# Patient Record
Sex: Female | Born: 2000
Health system: Southern US, Community
[De-identification: ages and names within clinical notes are randomized; demographics above are authoritative.]

## PROBLEM LIST (undated history)

## (undated) DIAGNOSIS — T7840XA Allergy, unspecified, initial encounter: Secondary | ICD-10-CM

## (undated) DIAGNOSIS — T148XXA Other injury of unspecified body region, initial encounter: Secondary | ICD-10-CM

## (undated) DIAGNOSIS — E039 Hypothyroidism, unspecified: Secondary | ICD-10-CM

## (undated) HISTORY — PX: FRACTURE SURGERY: SHX138

## (undated) HISTORY — DX: Allergy, unspecified, initial encounter: T78.40XA

---
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
## 2018-01-10 ENCOUNTER — Ambulatory Visit
Admission: RE | Admit: 2018-01-10 | Discharge: 2018-01-10 | Disposition: A | Discharge status: Home / Self Care | Payer: 59 | Source: Ambulatory Visit | Attending: Family Medicine | Admitting: Family Medicine

## 2018-01-10 DIAGNOSIS — E063 Autoimmune thyroiditis: Secondary | ICD-10-CM

## 2018-01-10 DIAGNOSIS — E069 Thyroiditis, unspecified: Secondary | ICD-10-CM | POA: Diagnosis not present

## 2018-01-10 DIAGNOSIS — E049 Nontoxic goiter, unspecified: Secondary | ICD-10-CM

## 2018-06-12 NOTE — Progress Notes (Signed)
Subjective:    Pamela Bennett is a 17 y.o. female and is here for a comprehensive physical exam.  No concerns. Finished semester. On Christmas break. Still taking Levothyroxine and Metformin. Weight up 7 pounds. No new habits other than college lifestyle.   Discussed HPV vaccine.    Current Outpatient Medications:  .  metFORMIN (GLUCOPHAGE) 500 MG tablet, TAKE 1 TABLET (500 MG TOTAL) BY MOUTH 2 TIMES DAILY WITH A MEAL., Disp: 60 tablet, Rfl: 6 .  SYNTHROID 25 MCG tablet, TAKE 1 TABLET BY MOUTH DAILY, Disp: 90 tablet, Rfl: 3  There are no preventive care reminders to display for this patient.  PMHx, SurgHx, SocialHx, Medications, and Allergies were reviewed in the Visit Navigator and updated as appropriate.   Past Medical History:  Diagnosis Date  . Hypothyroidism   . Ligament tear 02/2015   right small finger RCL tear     Past Surgical History:  Procedure Laterality Date  . CLOSED REDUCTION RADIAL / ULNAR SHAFT FRACTURE Left 2004  . LIGAMENT REPAIR Right 03/15/2015   Procedure: RIGHT SMALL FINGER RADIAL COLLATERAL LIGAMENT RECONSTRUCTION ;  Surgeon: Dominica SeverinWilliam Gramig, MD;  Location: [facility];  Service: Orthopedics;  Laterality: Right;  . PROXIMAL INTERPHALANGEAL FUSION (PIP) Right 03/15/2015   Procedure: RIGHT PROXIMAL INTERPHALANGEAL FUSION (PIP) JOINT WITH PALMARIS GRAFT;  Surgeon: Dominica SeverinWilliam Gramig, MD;  Location: [facility];  Service: Orthopedics;  Laterality: Right;  . SUPPRELIN IMPLANT Left 10/14/2009   upper arm     Family History  Adopted: Yes    Social History   Tobacco Use  . Smoking status: Never Smoker  . Smokeless tobacco: Never Used  Substance Use Topics  . Alcohol use: No  . Drug use: No    Review of Systems:   Pertinent items are noted in the HPI. Otherwise, ROS is negative.  Objective:   BP 120/64   Pulse (!) 58   Temp 98.5 F (36.9 C) (Oral)   Ht 5\' 3"  (1.6 m)   Wt 177 lb 12.8 oz (80.6 kg)   LMP  05/22/2018   SpO2 99%   BMI 31.50 kg/m    General appearance: alert, cooperative and appears stated age. Head: normocephalic, without obvious abnormality, atraumatic. Neck: no adenopathy, supple, symmetrical, trachea midline; thyroid not enlarged, symmetric, no tenderness/mass/nodules. Lungs: clear to auscultation bilaterally. Heart: regular rate and rhythm Abdomen: soft, non-tender; no masses,  no organomegaly. Extremities: extremities normal, atraumatic, no cyanosis or edema. Skin: skin color, texture, turgor normal, no rashes or lesions. Lymph: cervical, supraclavicular, and axillary nodes normal; no abnormal inguinal nodes palpated. Neurologic: grossly normal.  Assessment/Plan:   Pamela CheMargaret was seen today for annual exam.  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   [x]      Stressed the importance of regular exercise.    [x]     Substance Abuse: Discussed cessation/primary prevention of tobacco, alcohol, or other drug use; driving or other dangerous activities under the influence; availability of treatment for abuse.    [x]      Injury prevention: Discussed safety belts, safety helmets, smoke detector, smoking  near bedding or upholstery.    [x]      Sexuality: Discussed sexually transmitted diseases, partner selection, use of condoms, avoidance of unintended pregnancy  and contraceptive alternatives.    [x]     Dental health: Discussed importance of regular tooth brushing, flossing, and dental visits.   [x]      Health maintenance and immunizations reviewed. Please refer to Health maintenance section.   Helane RimaErica Abiageal Blowe, DO [facility]

## 2018-06-13 ENCOUNTER — Ambulatory Visit (INDEPENDENT_AMBULATORY_CARE_PROVIDER_SITE_OTHER): Payer: 59 | Admitting: Family Medicine

## 2018-06-13 ENCOUNTER — Encounter: Payer: Self-pay | Admitting: Family Medicine

## 2018-06-13 ENCOUNTER — Other Ambulatory Visit (HOSPITAL_COMMUNITY)
Admission: RE | Admit: 2018-06-13 | Discharge: 2018-06-13 | Disposition: A | Discharge status: Home / Self Care | Payer: 59 | Source: Ambulatory Visit | Attending: Family Medicine | Admitting: Family Medicine

## 2018-06-13 VITALS — BP 120/64 | HR 58 | Temp 98.5°F | Ht 63.0 in | Wt 177.8 lb

## 2018-06-13 DIAGNOSIS — E349 Endocrine disorder, unspecified: Secondary | ICD-10-CM

## 2018-06-13 DIAGNOSIS — Z113 Encounter for screening for infections with a predominantly sexual mode of transmission: Secondary | ICD-10-CM | POA: Insufficient documentation

## 2018-06-13 DIAGNOSIS — E063 Autoimmune thyroiditis: Secondary | ICD-10-CM

## 2018-06-13 DIAGNOSIS — Z0000 Encounter for general adult medical examination without abnormal findings: Secondary | ICD-10-CM

## 2018-06-13 DIAGNOSIS — Z1331 Encounter for screening for depression: Secondary | ICD-10-CM | POA: Diagnosis not present

## 2018-06-13 DIAGNOSIS — E88819 Insulin resistance, unspecified: Secondary | ICD-10-CM

## 2018-06-13 DIAGNOSIS — Z13220 Encounter for screening for lipoid disorders: Secondary | ICD-10-CM | POA: Diagnosis not present

## 2018-06-13 DIAGNOSIS — E8881 Metabolic syndrome: Secondary | ICD-10-CM | POA: Diagnosis not present

## 2018-06-13 LAB — LIPID PANEL
Cholesterol: 241 mg/dL — ABNORMAL HIGH (ref 0–200)
HDL: 38.3 mg/dL — ABNORMAL LOW (ref >39.00)
Total CHOL/HDL Ratio: 6
Triglycerides: 519 mg/dL — ABNORMAL HIGH (ref 0.0–149.0)

## 2018-06-13 LAB — CBC WITH DIFFERENTIAL/PLATELET
Basophils Absolute: 0 10*3/uL (ref 0.0–0.1)
Basophils Relative: 0.7 % (ref 0.0–3.0)
Eosinophils Absolute: 0.1 10*3/uL (ref 0.0–0.7)
Eosinophils Relative: 1.8 % (ref 0.0–5.0)
HCT: 44.3 % (ref 36.0–49.0)
Hemoglobin: 14.8 g/dL (ref 12.0–16.0)
Lymphocytes Relative: 31.5 % (ref 24.0–48.0)
Lymphs Abs: 1.8 10*3/uL (ref 0.7–4.0)
MCHC: 33.5 g/dL (ref 31.0–37.0)
MCV: 87.9 fl (ref 78.0–98.0)
Monocytes Absolute: 0.5 10*3/uL (ref 0.1–1.0)
Monocytes Relative: 8.1 % (ref 3.0–12.0)
Neutro Abs: 3.3 10*3/uL (ref 1.4–7.7)
Neutrophils Relative %: 57.9 % (ref 43.0–71.0)
Platelets: 305 10*3/uL (ref 150.0–575.0)
RBC: 5.04 Mil/uL (ref 3.80–5.70)
RDW: 13.6 % (ref 11.4–15.5)
WBC: 5.7 10*3/uL (ref 4.5–13.5)

## 2018-06-13 LAB — COMPREHENSIVE METABOLIC PANEL
ALT: 44 U/L — ABNORMAL HIGH (ref 0–35)
AST: 31 U/L (ref 0–37)
Albumin: 4.7 g/dL (ref 3.5–5.2)
Alkaline Phosphatase: 70 U/L (ref 47–119)
BUN: 9 mg/dL (ref 6–23)
CO2: 28 mEq/L (ref 19–32)
Calcium: 10.2 mg/dL (ref 8.4–10.5)
Chloride: 101 mEq/L (ref 96–112)
Creatinine, Ser: 0.87 mg/dL (ref 0.40–1.20)
GFR: 88.4 mL/min (ref >60.00)
Glucose, Bld: 94 mg/dL (ref 70–99)
Potassium: 4.3 mEq/L (ref 3.5–5.1)
Sodium: 138 mEq/L (ref 135–145)
Total Bilirubin: 0.4 mg/dL (ref 0.2–1.2)
Total Protein: 7.8 g/dL (ref 6.0–8.3)

## 2018-06-13 LAB — T4, FREE: Free T4: 0.82 ng/dL (ref 0.60–1.60)

## 2018-06-13 LAB — LDL CHOLESTEROL, DIRECT: Direct LDL: 135 mg/dL

## 2018-06-13 LAB — HEMOGLOBIN A1C: Hgb A1c MFr Bld: 5.5 % (ref 4.6–6.5)

## 2018-06-13 LAB — TSH: TSH: 2.55 u[IU]/mL (ref 0.40–5.00)

## 2018-06-13 NOTE — Addendum Note (Signed)
Addended by: London SheerFRIZZELL, BAILEY T on: 06/13/2018 10:22 AM   Modules accepted: Orders

## 2018-06-14 LAB — URINE CYTOLOGY ANCILLARY ONLY
Chlamydia: NEGATIVE
Neisseria Gonorrhea: NEGATIVE
Trichomonas: NEGATIVE

## 2018-06-16 ENCOUNTER — Encounter: Payer: Self-pay | Admitting: Family Medicine

## 2018-06-17 ENCOUNTER — Other Ambulatory Visit: Payer: Self-pay

## 2018-06-17 DIAGNOSIS — R899 Unspecified abnormal finding in specimens from other organs, systems and tissues: Secondary | ICD-10-CM

## 2018-06-17 LAB — TESTOS,TOTAL,FREE AND SHBG (FEMALE)
Free Testosterone: 7.3 pg/mL — ABNORMAL HIGH (ref 0.1–6.4)
Sex Hormone Binding: 18 nmol/L (ref 17–124)
Testosterone, Total, LC-MS-MS: 43 ng/dL (ref 2–45)

## 2018-06-17 LAB — URINE CYTOLOGY ANCILLARY ONLY
Bacterial vaginitis: NEGATIVE
Candida vaginitis: NEGATIVE

## 2018-06-17 NOTE — Telephone Encounter (Signed)
Left message to return call to our office.  

## 2018-06-26 NOTE — Progress Notes (Signed)
Kaymani Fauth is a 18 y.o. female is here for follow up.  History of Present Illness:   HPI: Patient presents for recheck of labs. Endorsed drinking more alcohol than normal for the week just before labs last week. Adopted. FamHx not clear. No tobacco, drugs. No ETOH this week. Exercises.   There are no preventive care reminders to display for this patient. Depression screen [facility] 2/9 06/13/2018 06/07/2017  Decreased Interest 0 0  Down, Depressed, Hopeless 0 0  PHQ - 2 Score 0 0  Altered sleeping 0 -  Tired, decreased energy 0 -  Change in appetite 0 -  Feeling bad or failure about yourself  0 -  Trouble concentrating 0 -  Moving slowly or fidgety/restless 0 -  Suicidal thoughts 0 -  PHQ-9 Score 0 -  Difficult doing work/chores Not difficult at all -   PMHx, SurgHx, SocialHx, FamHx, Medications, and Allergies were reviewed in the Visit Navigator and updated as appropriate.   Patient Active Problem List   Diagnosis Date Noted  . 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 Regular astigmatism of both eyes 06/08/2017  . 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 Thyroiditis, autoimmune 11/21/2012  . Osgood-Schlatter's disease 02/10/2012  . Goiter   . Hypothyroidism, acquired, autoimmune   . Short stature   . Dyspepsia   . Obesity   . Precocious sexual development and puberty 10/15/2010   Social History   Tobacco Use  . Smoking status: Never Smoker  . Smokeless tobacco: Never Used  Substance Use Topics  . Alcohol use: No  . Drug use: No   Current Medications and Allergies:   Current Outpatient Medications:  .  metFORMIN (GLUCOPHAGE) 500 MG tablet, TAKE 1 TABLET (500 MG TOTAL) BY MOUTH 2 TIMES DAILY WITH A MEAL., Disp: 60 tablet, Rfl: 6 .  SYNTHROID 25 MCG tablet, TAKE 1 TABLET BY MOUTH DAILY, Disp: 90 tablet, Rfl: 3 .  rosuvastatin (CRESTOR) 10 MG tablet, Take 1 tablet (10 mg total) by mouth daily., Disp: 90 tablet, Rfl: 3   Allergies  Allergen Reactions    . Almond Oil Other (See Comments)    GI UPSET  . Carrot [Daucus Carota] Other (See Comments)    GI UPSET   Review of Systems   Pertinent items are noted in the HPI. Otherwise, a complete ROS is negative.  Vitals:   Vitals:   06/27/18 1610  BP: 102/70  Pulse: 68  Temp: 98.7 F (37.1 C)  TempSrc: Oral  SpO2: 97%  Weight: 179 lb 9.6 oz (81.5 kg)  Height: 5\' 3"  (1.6 m)     Body mass index is 31.81 kg/m.  Physical Exam:   Physical Exam Vitals signs and nursing note reviewed.  HENT:     Head: Normocephalic and atraumatic.  Eyes:     Pupils: Pupils are equal, round, and reactive to light.  Neck:     Musculoskeletal: Normal range of motion and neck supple.  Cardiovascular:     Rate and Rhythm: Normal rate and regular rhythm.     Heart sounds: Normal heart sounds.  Pulmonary:     Effort: Pulmonary effort is normal.  Abdominal:     Palpations: Abdomen is soft.  Skin:    General: Skin is warm.  Psychiatric:        Behavior: Behavior normal.    Results for orders placed or performed in visit on 06/27/18  Lipid panel  Result Value Ref Range   Cholesterol 236 (H) 0 - 200 mg/dL  Triglycerides (H) 0.0 - 149.0 mg/dL    017.5 Triglyceride is over 400; calculations on Lipids are invalid.   HDL 43.00 >39.00 mg/dL   Total CHOL/HDL Ratio 5   Comprehensive metabolic panel  Result Value Ref Range   Sodium 137 135 - 145 mEq/L   Potassium 3.8 3.5 - 5.1 mEq/L   Chloride 102 96 - 112 mEq/L   CO2 28 19 - 32 mEq/L   Glucose, Bld 85 70 - 99 mg/dL   BUN 13 6 - 23 mg/dL   Creatinine, Ser 1.02 0.40 - 1.20 mg/dL   Total Bilirubin 0.5 0.2 - 1.2 mg/dL   Alkaline Phosphatase 62 47 - 119 U/L   AST 27 0 - 37 U/L   ALT 43 (H) 0 - 35 U/L   Total Protein 7.6 6.0 - 8.3 g/dL   Albumin 4.7 3.5 - 5.2 g/dL   Calcium 9.9 8.4 - 58.5 mg/dL   GFR 27.78 >24.23 mL/min  LDL cholesterol, direct  Result Value Ref Range   Direct LDL 136.0 mg/dL   US Abdomen Limited Ruq  Result Date:  06/29/2018 CLINICAL DATA:  Elevated LFTs EXAM: ULTRASOUND ABDOMEN LIMITED RIGHT UPPER QUADRANT COMPARISON:  None. FINDINGS: Gallbladder: No gallstones or wall thickening visualized. No sonographic Murphy sign noted by sonographer. Common bile duct: Diameter: 2.9 mm Liver: Mild increased hepatic echogenicity compatible with hepatic steatosis or fatty infiltration. No focal hepatic abnormality or intrahepatic biliary dilatation. No surrounding free fluid or ascites. Portal vein is patent on color Doppler imaging with normal direction of blood flow towards the liver. IMPRESSION: Mild hepatic steatosis.  Negative for gallstones or acute finding. Electronically Signed   By: Judie Petit.  Shick M.D.   On: 06/29/2018 11:09   Assessment and Plan:   Jlisa was seen today for follow-up.  Diagnoses and all orders for this visit:  Mixed hyperlipidemia -     Lipid panel -     Comprehensive metabolic panel -     LDL cholesterol, direct -     rosuvastatin (CRESTOR) 10 MG tablet; Take 1 tablet (10 mg total) by mouth daily.  Hypothyroidism, acquired, autoimmune Comments: Not on Levothyroxine at this time.   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 Orders and follow up as documented in EpicCare, reviewed diet, exercise and weight control, cardiovascular risk and specific lipid/LDL goals reviewed, reviewed medications and side effects in detail.  . Reviewed expectations re: course of current medical issues. . Outlined signs and symptoms indicating need for more acute intervention. . Patient verbalized understanding and all questions were answered. . Patient received an After Visit Summary.  Helane Rima, DO [place] 07/04/2018

## 2018-06-27 ENCOUNTER — Ambulatory Visit (INDEPENDENT_AMBULATORY_CARE_PROVIDER_SITE_OTHER): Payer: 59 | Admitting: Family Medicine

## 2018-06-27 ENCOUNTER — Encounter: Payer: Self-pay | Admitting: Family Medicine

## 2018-06-27 VITALS — BP 102/70 | HR 68 | Temp 98.7°F | Ht 63.0 in | Wt 179.6 lb

## 2018-06-27 DIAGNOSIS — R899 Unspecified abnormal finding in specimens from other organs, systems and tissues: Secondary | ICD-10-CM

## 2018-06-27 DIAGNOSIS — E349 Endocrine disorder, unspecified: Secondary | ICD-10-CM

## 2018-06-27 DIAGNOSIS — E8881 Metabolic syndrome: Secondary | ICD-10-CM

## 2018-06-27 DIAGNOSIS — E782 Mixed hyperlipidemia: Secondary | ICD-10-CM

## 2018-06-27 DIAGNOSIS — E063 Autoimmune thyroiditis: Secondary | ICD-10-CM | POA: Diagnosis not present

## 2018-06-27 DIAGNOSIS — E88819 Insulin resistance, unspecified: Secondary | ICD-10-CM

## 2018-06-27 NOTE — Telephone Encounter (Signed)
Patient has app today  

## 2018-06-28 ENCOUNTER — Other Ambulatory Visit: Payer: Self-pay

## 2018-06-28 DIAGNOSIS — R945 Abnormal results of liver function studies: Principal | ICD-10-CM

## 2018-06-28 DIAGNOSIS — R7989 Other specified abnormal findings of blood chemistry: Secondary | ICD-10-CM

## 2018-06-28 LAB — COMPREHENSIVE METABOLIC PANEL
ALT: 43 U/L — ABNORMAL HIGH (ref 0–35)
AST: 27 U/L (ref 0–37)
Albumin: 4.7 g/dL (ref 3.5–5.2)
Alkaline Phosphatase: 62 U/L (ref 47–119)
BUN: 13 mg/dL (ref 6–23)
CO2: 28 mEq/L (ref 19–32)
Calcium: 9.9 mg/dL (ref 8.4–10.5)
Chloride: 102 mEq/L (ref 96–112)
Creatinine, Ser: 0.81 mg/dL (ref 0.40–1.20)
GFR: 95.96 mL/min (ref >60.00)
Glucose, Bld: 85 mg/dL (ref 70–99)
Potassium: 3.8 mEq/L (ref 3.5–5.1)
Sodium: 137 mEq/L (ref 135–145)
Total Bilirubin: 0.5 mg/dL (ref 0.2–1.2)
Total Protein: 7.6 g/dL (ref 6.0–8.3)

## 2018-06-28 LAB — LIPID PANEL
Cholesterol: 236 mg/dL — ABNORMAL HIGH (ref 0–200)
HDL: 43 mg/dL (ref >39.00)
Total CHOL/HDL Ratio: 5
Triglycerides: 426 mg/dL — ABNORMAL HIGH (ref 0.0–149.0)

## 2018-06-28 LAB — LDL CHOLESTEROL, DIRECT: Direct LDL: 136 mg/dL

## 2018-06-29 ENCOUNTER — Ambulatory Visit
Admission: RE | Admit: 2018-06-29 | Discharge: 2018-06-29 | Disposition: A | Discharge status: Home / Self Care | Payer: 59 | Source: Ambulatory Visit | Attending: Family Medicine | Admitting: Family Medicine

## 2018-06-29 DIAGNOSIS — R7989 Other specified abnormal findings of blood chemistry: Secondary | ICD-10-CM

## 2018-06-29 DIAGNOSIS — R945 Abnormal results of liver function studies: Principal | ICD-10-CM

## 2018-06-29 DIAGNOSIS — K760 Fatty (change of) liver, not elsewhere classified: Secondary | ICD-10-CM | POA: Diagnosis not present

## 2018-07-01 ENCOUNTER — Encounter: Payer: Self-pay | Admitting: Family Medicine

## 2018-07-04 ENCOUNTER — Encounter: Payer: Self-pay | Admitting: Family Medicine

## 2018-07-04 MED ORDER — ROSUVASTATIN CALCIUM 10 MG PO TABS: 10.0000 mg | ORAL_TABLET | Freq: Every day | ORAL | 3 refills | Status: DC

## 2018-07-12 MED FILL — ROSUVASTATIN CALCIUM 10 MG: 10 | 90 days supply | Qty: 90 | Fill #0

## 2018-07-23 DIAGNOSIS — R21 Rash and other nonspecific skin eruption: Secondary | ICD-10-CM | POA: Diagnosis not present

## 2018-07-25 ENCOUNTER — Encounter: Payer: Self-pay | Admitting: Family Medicine

## 2018-11-16 MED FILL — ROSUVASTATIN CALCIUM 10 MG: 10 | 90 days supply | Qty: 90 | Fill #1

## 2018-12-12 ENCOUNTER — Encounter: Payer: Self-pay | Admitting: Family Medicine

## 2018-12-12 NOTE — Progress Notes (Deleted)
Virtual Visit via Video   Due to the COVID-19 pandemic, this visit was completed with telemedicine (audio/video) technology to reduce patient and provider exposure as well as to preserve personal protective equipment.   I connected with Marya LandryMargaret Marilyn Mapp by a video enabled telemedicine application and verified that I am speaking with the correct person using two identifiers. Location patient: Home Location provider: [facility], Office Persons participating in the virtual visit: Weston SettleMargaret Marilyn Mester, Terryann Verbeek Y Kelin Borum, CMA Barnie MortJoEllen Delainee Tramel, CMA acting as scribe for Dr. Helane RimaErica Wallace.    I discussed the limitations of evaluation and management by telemedicine and the availability of in person appointments. The patient expressed understanding and agreed to proceed.  Care Team   Patient Care Team: Helane RimaWallace, Erica, DO as PCP - General (Family Medicine)  Subjective:   HPI:  Mixed hyperlipidemia Labs checked at 06/27/18 visit. Patient started on Crestor 10mg  daily.  Is the patient taking medications without problems? {Yes/No:30480221}. Does the patient complain of muscle aches?  {Yes/No:30480221}. Trying to exercise on a regular basis? {Yes/No:30480221}. Compliant with diet? {Yes/No:30480221}.  Lab Results  Component Value Date   CHOL 236 (H) 06/27/2018   HDL 43.00 06/27/2018   LDLDIRECT 136.0 06/27/2018   TRIG (H) 06/27/2018    426.0 Triglyceride is over 400; calculations on Lipids are invalid.   CHOLHDL 5 06/27/2018   Lab Results  Component Value Date   ALT 43 (H) 06/27/2018   AST 27 06/27/2018   ALKPHOS 62 06/27/2018   BILITOT 0.5 06/27/2018      Hypothyroidism, acquired, autoimmune At last visit patient was not on  Levothyroxine.  Lab Results  Component Value Date   TSH 2.55 06/13/2018    Insulin resistance Patient currently on Metformin 500bid.   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 Regular astigmatism of both eyes 06/08/2017  . 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 Thyroiditis, autoimmune 11/21/2012  . Osgood-Schlatter's disease 02/10/2012  . Goiter   . Hypothyroidism, acquired, autoimmune   . Short stature   . Dyspepsia   . Obesity   . Precocious sexual development and puberty 10/15/2010    Social History   Tobacco Use  . Smoking status: Never Smoker  . Smokeless tobacco: Never Used  Substance Use Topics  . Alcohol use: No    Current Outpatient Medications:  .  metFORMIN (GLUCOPHAGE) 500 MG tablet, TAKE 1 TABLET (500 MG TOTAL) BY MOUTH 2 TIMES DAILY WITH A MEAL., Disp: 60 tablet, Rfl: 6 .  rosuvastatin (CRESTOR) 10 MG tablet, Take 1 tablet (10 mg total) by mouth daily., Disp: 90 tablet, Rfl: 3 .  SYNTHROID 25 MCG tablet, TAKE 1 TABLET BY MOUTH DAILY, Disp: 90 tablet, Rfl: 3  Allergies  Allergen Reactions  . Almond Oil Other (See Comments)    GI UPSET  . Carrot [Daucus Carota] Other (See Comments)    GI UPSET    Objective:   VITALS: Per patient if applicable, see vitals. GENERAL: Alert, appears well and in no acute distress. HEENT: Atraumatic, conjunctiva clear, no obvious abnormalities on inspection of external nose and ears. NECK: Normal movements of the head and neck. CARDIOPULMONARY: No increased WOB. Speaking in clear sentences. I:E ratio WNL.  MS: Moves all visible extremities without noticeable abnormality. PSYCH: Pleasant and cooperative, well-groomed. Speech normal rate and rhythm. Affect is appropriate. Insight  and judgement are appropriate. Attention is focused, linear, and appropriate.  NEURO: CN grossly intact. Oriented as arrived to appointment on time with no prompting. Moves both UE equally.  SKIN: No obvious lesions, wounds, erythema, or cyanosis noted on face or hands.  Depression screen [facility] 2/9 06/13/2018 06/07/2017  Decreased  Interest 0 0  Down, Depressed, Hopeless 0 0  PHQ - 2 Score 0 0  Altered sleeping 0 -  Tired, decreased energy 0 -  Change in appetite 0 -  Feeling bad or failure about yourself  0 -  Trouble concentrating 0 -  Moving slowly or fidgety/restless 0 -  Suicidal thoughts 0 -  PHQ-9 Score 0 -  Difficult doing work/chores Not difficult at all -    Assessment and Plan:   There are no diagnoses linked to this encounter.  Marland Kitchen COVID-19 Education: The signs and symptoms of COVID-19 were discussed with the patient and how to seek care for testing if needed. The importance of social distancing was discussed today. . Reviewed expectations re: course of current medical issues. . Discussed self-management of symptoms. . Outlined signs and symptoms indicating need for more acute intervention. . Patient verbalized understanding and all questions were answered. Marland Kitchen Health Maintenance issues including appropriate healthy diet, exercise, and smoking avoidance were discussed with patient. . See orders for this visit as documented in the electronic medical record.  Francella Solian, CMA  Records requested if needed. Time spent: *** minutes, of which >50% was spent in obtaining information about her symptoms, reviewing her previous labs, evaluations, and treatments, counseling her about her condition (please see the discussed topics above), and developing a plan to further investigate it; she had a number of questions which I addressed.

## 2018-12-13 ENCOUNTER — Ambulatory Visit: Payer: 59 | Admitting: Family Medicine

## 2018-12-13 ENCOUNTER — Other Ambulatory Visit: Payer: 59

## 2018-12-13 ENCOUNTER — Other Ambulatory Visit: Payer: Self-pay

## 2018-12-13 DIAGNOSIS — E538 Deficiency of other specified B group vitamins: Secondary | ICD-10-CM

## 2018-12-13 DIAGNOSIS — E349 Endocrine disorder, unspecified: Secondary | ICD-10-CM

## 2018-12-13 DIAGNOSIS — E063 Autoimmune thyroiditis: Secondary | ICD-10-CM

## 2018-12-13 DIAGNOSIS — E559 Vitamin D deficiency, unspecified: Secondary | ICD-10-CM

## 2018-12-14 ENCOUNTER — Other Ambulatory Visit: Payer: Self-pay

## 2018-12-14 ENCOUNTER — Other Ambulatory Visit (INDEPENDENT_AMBULATORY_CARE_PROVIDER_SITE_OTHER): Payer: 59

## 2018-12-14 DIAGNOSIS — E063 Autoimmune thyroiditis: Secondary | ICD-10-CM | POA: Diagnosis not present

## 2018-12-14 DIAGNOSIS — E559 Vitamin D deficiency, unspecified: Secondary | ICD-10-CM

## 2018-12-14 DIAGNOSIS — E349 Endocrine disorder, unspecified: Secondary | ICD-10-CM

## 2018-12-14 LAB — VITAMIN D 25 HYDROXY (VIT D DEFICIENCY, FRACTURES): VITD: 28.87 ng/mL — ABNORMAL LOW (ref 30.00–100.00)

## 2018-12-14 LAB — CBC WITH DIFFERENTIAL/PLATELET
Basophils Absolute: 0 10*3/uL (ref 0.0–0.1)
Basophils Relative: 0.5 % (ref 0.0–3.0)
Eosinophils Absolute: 0.1 10*3/uL (ref 0.0–0.7)
Eosinophils Relative: 1.6 % (ref 0.0–5.0)
HCT: 42.7 % (ref 36.0–46.0)
Hemoglobin: 14.2 g/dL (ref 12.0–15.0)
Lymphocytes Relative: 31.4 % (ref 12.0–46.0)
Lymphs Abs: 2.3 10*3/uL (ref 0.7–4.0)
MCHC: 33.3 g/dL (ref 30.0–36.0)
MCV: 89.7 fl (ref 78.0–100.0)
Monocytes Absolute: 0.7 10*3/uL (ref 0.1–1.0)
Monocytes Relative: 9.9 % (ref 3.0–12.0)
Neutro Abs: 4.1 10*3/uL (ref 1.4–7.7)
Neutrophils Relative %: 56.6 % (ref 43.0–77.0)
Platelets: 273 10*3/uL (ref 150.0–400.0)
RBC: 4.76 Mil/uL (ref 3.87–5.11)
RDW: 13.1 % (ref 11.5–14.6)
WBC: 7.3 10*3/uL (ref 4.5–10.5)

## 2018-12-14 LAB — COMPREHENSIVE METABOLIC PANEL
ALT: 22 U/L (ref 0–35)
AST: 20 U/L (ref 0–37)
Albumin: 4.6 g/dL (ref 3.5–5.2)
Alkaline Phosphatase: 58 U/L (ref 39–117)
BUN: 12 mg/dL (ref 6–23)
CO2: 26 mEq/L (ref 19–32)
Calcium: 9.4 mg/dL (ref 8.4–10.5)
Chloride: 105 mEq/L (ref 96–112)
Creatinine, Ser: 0.79 mg/dL (ref 0.40–1.20)
GFR: 92.49 mL/min (ref >60.00)
Glucose, Bld: 94 mg/dL (ref 70–99)
Potassium: 4.7 mEq/L (ref 3.5–5.1)
Sodium: 139 mEq/L (ref 135–145)
Total Bilirubin: 0.4 mg/dL (ref 0.2–1.2)
Total Protein: 7.5 g/dL (ref 6.0–8.3)

## 2018-12-14 LAB — LIPID PANEL
Cholesterol: 125 mg/dL (ref 0–200)
HDL: 42.3 mg/dL (ref >39.00)
LDL Cholesterol: 56 mg/dL (ref 0–99)
NonHDL: 83.01
Total CHOL/HDL Ratio: 3
Triglycerides: 136 mg/dL (ref 0.0–149.0)
VLDL: 27.2 mg/dL (ref 0.0–40.0)

## 2018-12-14 LAB — T4, FREE: Free T4: 0.74 ng/dL (ref 0.60–1.60)

## 2018-12-14 LAB — TSH: TSH: 1.9 u[IU]/mL (ref 0.35–5.50)

## 2018-12-25 NOTE — Progress Notes (Signed)
Virtual Visit via Video   Due to the COVID-19 pandemic, this visit was completed with telemedicine (audio/video) technology to reduce patient and provider exposure as well as to preserve personal protective equipment.   I connected with Marya LandryMargaret Marilyn Bingley by a video enabled telemedicine application and verified that I am speaking with the correct person using two identifiers. Location patient: Home Location provider: [facility], Office Persons participating in the virtual visit: Gar GibbonMargaret Marilyn Shasteen, Laneisha Mino, DO Barnie MortJoEllen Thompson, CMA acting as scribe for Dr. Helane RimaErica Gretel Cantu.   I discussed the limitations of evaluation and management by telemedicine and the availability of in person appointments. The patient expressed understanding and agreed to proceed.  Care Team   Patient Care Team: Helane RimaWallace, Layton Naves, DO as PCP - General (Family Medicine)  Subjective:   HPI:   1. Elevated LFTs with HLD Is the patient taking medications without problems? Yes. Does the patient complain of muscle aches?  No. Trying to exercise on a regular basis? Yes. Compliant with diet? No.  Lab Results  Component Value Date   CHOL 125 12/14/2018   HDL 42.30 12/14/2018   LDLCALC 56 12/14/2018   LDLDIRECT 136.0 06/27/2018   TRIG 136.0 12/14/2018   CHOLHDL 3 12/14/2018   Lab Results  Component Value Date   ALT 22 12/14/2018   AST 20 12/14/2018   ALKPHOS 58 12/14/2018   BILITOT 0.4 12/14/2018     2. Insulin resistance She is taking metformin daily. She is not having any issues with this medication at all.   Review of Systems  Constitutional: Negative for chills, fever, malaise/fatigue and weight loss.  HENT: Negative for hearing loss and tinnitus.   Respiratory: Negative for cough, shortness of breath and wheezing.   Cardiovascular: Negative for chest pain, palpitations and leg swelling.  Gastrointestinal: Negative for abdominal pain, constipation, diarrhea, nausea and vomiting.    Genitourinary: Negative for dysuria and urgency.  Musculoskeletal: Negative for joint pain and myalgias.  Skin: Negative for rash.  Neurological: Negative for dizziness and headaches.  Psychiatric/Behavioral: Negative for depression, substance abuse and suicidal ideas. The patient is not nervous/anxious.     Patient Active Problem List   Diagnosis Date Noted  . 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 Regular astigmatism of both eyes 06/08/2017  . 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 Thyroiditis, autoimmune 11/21/2012  . Osgood-Schlatter's disease 02/10/2012  . Goiter   . Hypothyroidism, acquired, autoimmune   . Short stature   . Dyspepsia   . Obesity   . Precocious sexual development and puberty 10/15/2010    Social History   Tobacco Use  . Smoking status: Never Smoker  . Smokeless tobacco: Never Used  Substance Use Topics  . Alcohol use: No   Current Outpatient Medications:  .  metFORMIN (GLUCOPHAGE) 500 MG tablet, TAKE 1 TABLET (500 MG TOTAL) BY MOUTH 2 TIMES DAILY WITH A MEAL., Disp: 60 tablet, Rfl: 6 .  rosuvastatin (CRESTOR) 10 MG tablet, Take 1 tablet (10 mg total) by mouth daily., Disp: 90 tablet, Rfl: 3 .  SYNTHROID 25 MCG tablet, TAKE 1 TABLET BY MOUTH DAILY, Disp: 90 tablet, Rfl: 3  Allergies  Allergen Reactions  . Almond Oil Other (See Comments)    GI UPSET  . Carrot [Daucus Carota] Other (See Comments)    GI UPSET   Objective:   VITALS: Per patient if applicable, see vitals. GENERAL: Alert, appears well and in no acute distress. HEENT: Atraumatic, conjunctiva clear, no obvious abnormalities on inspection of external nose  and ears. NECK: Normal movements of the head and neck. CARDIOPULMONARY: No increased WOB. Speaking in clear sentences. I:E ratio WNL.  MS: Moves all visible extremities without noticeable abnormality. PSYCH: Pleasant and cooperative, well-groomed. Speech normal rate and rhythm. Affect is appropriate. Insight and judgement are  appropriate. Attention is focused, linear, and appropriate.  NEURO: CN grossly intact. Oriented as arrived to appointment on time with no prompting. Moves both UE equally.  SKIN: No obvious lesions, wounds, erythema, or cyanosis noted on face or hands.  Depression screen [facility] 2/9 12/26/2018 06/13/2018 06/07/2017  Decreased Interest 0 0 0  Down, Depressed, Hopeless 0 0 0  PHQ - 2 Score 0 0 0  Altered sleeping 0 0 -  Tired, decreased energy 0 0 -  Change in appetite 0 0 -  Feeling bad or failure about yourself  0 0 -  Trouble concentrating 0 0 -  Moving slowly or fidgety/restless 0 0 -  Suicidal thoughts 0 0 -  PHQ-9 Score 0 0 -  Difficult doing work/chores Not difficult at all Not difficult at all -   Assessment and Plan:   Crescent was seen today for follow-up.  Diagnoses and all orders for this visit:  Elevated LFTs Comments: RESOLVED.  Mixed hyperlipidemia Comments: AT GOAL.  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 COVID-19 Education: The signs and symptoms of COVID-19 were discussed with the patient and how to seek care for testing if needed. The importance of social distancing was discussed today. . Reviewed expectations re: course of current medical issues. . Discussed self-management of symptoms. . Outlined signs and symptoms indicating need for more acute intervention. . Patient verbalized understanding and all questions were answered. Marland Kitchen Health Maintenance issues including appropriate healthy diet, exercise, and smoking avoidance were discussed with patient. . See orders for this visit as documented in the electronic medical record.  Briscoe Deutscher, DO  Records requested if needed. Time spent: 25 minutes, of which >50% was spent in obtaining information about her symptoms, reviewing her  previous labs, evaluations, and treatments, counseling her about her condition (please see the discussed topics above), and developing a plan to further investigate it; she had a number of questions which I addressed.

## 2018-12-26 ENCOUNTER — Ambulatory Visit (INDEPENDENT_AMBULATORY_CARE_PROVIDER_SITE_OTHER): Payer: 59 | Admitting: Family Medicine

## 2018-12-26 ENCOUNTER — Other Ambulatory Visit: Payer: Self-pay

## 2018-12-26 ENCOUNTER — Encounter: Payer: Self-pay | Admitting: Family Medicine

## 2018-12-26 VITALS — Ht 63.0 in | Wt 179.0 lb

## 2018-12-26 DIAGNOSIS — R945 Abnormal results of liver function studies: Secondary | ICD-10-CM

## 2018-12-26 DIAGNOSIS — E8881 Metabolic syndrome: Secondary | ICD-10-CM

## 2018-12-26 DIAGNOSIS — E063 Autoimmune thyroiditis: Secondary | ICD-10-CM | POA: Diagnosis not present

## 2018-12-26 DIAGNOSIS — R7989 Other specified abnormal findings of blood chemistry: Secondary | ICD-10-CM

## 2018-12-26 DIAGNOSIS — E349 Endocrine disorder, unspecified: Secondary | ICD-10-CM | POA: Diagnosis not present

## 2018-12-26 DIAGNOSIS — E782 Mixed hyperlipidemia: Secondary | ICD-10-CM | POA: Diagnosis not present

## 2018-12-26 DIAGNOSIS — E88819 Insulin resistance, unspecified: Secondary | ICD-10-CM

## 2019-01-11 ENCOUNTER — Ambulatory Visit (INDEPENDENT_AMBULATORY_CARE_PROVIDER_SITE_OTHER): Payer: 59 | Admitting: Physician Assistant

## 2019-01-11 ENCOUNTER — Encounter: Payer: Self-pay | Admitting: Physician Assistant

## 2019-01-11 DIAGNOSIS — Z20828 Contact with and (suspected) exposure to other viral communicable diseases: Secondary | ICD-10-CM

## 2019-01-11 DIAGNOSIS — Z7189 Other specified counseling: Secondary | ICD-10-CM | POA: Diagnosis not present

## 2019-01-11 DIAGNOSIS — Z20822 Contact with and (suspected) exposure to covid-19: Secondary | ICD-10-CM

## 2019-01-11 NOTE — Progress Notes (Signed)
Virtual Visit via Video   I connected with Dionisio David on 01/11/19 at  1:00 PM EDT by a video enabled telemedicine application and verified that I am speaking with the correct person using two identifiers. Location patient: Home Location provider: [facility], Office Persons participating in the virtual visit: Amritha, Yorke PA-C.  I discussed the limitations of evaluation and management by telemedicine and the availability of in person appointments. The patient expressed understanding and agreed to proceed.  I acted as a Education administrator for [employer], PA-C [employer], LPN  Subjective:   HPI:   Patient is requesting evaluation for possible COVID-19.  Symptom onset: None  Travel or Contacts: Pt was exposed to COVID due to co-worker was positive, was notified today. She always wears her mask and performs adequate hand hygiene and social distancing as able when working.  Patient endorses the following symptoms: Fever >100.57F []   Yes [x]   No []   Unknown Subjective fever (felt feverish) []   Yes [x]   No []   Unknown Chills []   Yes [x]   No []   Unknown Muscle aches (myalgia) []   Yes [x]   No []   Unknown Runny nose (rhinorrhea) []   Yes [x]   No []   Unknown Sore throat []   Yes [x]   No []   Unknown Cough (new onset or worsening of chronic cough) []   Yes [x]   No []   Unknown Shortness of breath (dyspnea) []   Yes [x]   No []   Unknown Nausea or vomiting []   Yes [x]   No []   Unknown Headache []   Yes [x]   No []   Unknown Abdominal pain  []   Yes [x]   No []   Unknown Diarrhea (?3 loose/looser than normal stools/24hr period) []   Yes [x]   No []   Unknown Other, specify: No loss of taste or smell Treatments tried: None  Patient risk factors: Smoker? []   Current []   Former [x]   Never If female, currently pregnant? []   Yes [x]   No  ROS: See pertinent positives and negatives per HPI.  Patient Active Problem List   Diagnosis Date Noted  . Refractive  amblyopia, left 06/08/2017  . Regular astigmatism of both eyes 06/08/2017  . 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 Thyroiditis, autoimmune 11/21/2012  . Osgood-Schlatter's disease 02/10/2012  . Goiter   . Hypothyroidism, acquired, autoimmune   . Short stature   . Dyspepsia   . Obesity   . Precocious sexual development and puberty 10/15/2010    Social History   Tobacco Use  . Smoking status: Never Smoker  . Smokeless tobacco: Never Used  Substance Use Topics  . Alcohol use: No    Current Outpatient Medications:  .  metFORMIN (GLUCOPHAGE) 500 MG tablet, TAKE 1 TABLET (500 MG TOTAL) BY MOUTH 2 TIMES DAILY WITH A MEAL., Disp: 60 tablet, Rfl: 6 .  rosuvastatin (CRESTOR) 10 MG tablet, Take 1 tablet (10 mg total) by mouth daily., Disp: 90 tablet, Rfl: 3 .  SYNTHROID 25 MCG tablet, TAKE 1 TABLET BY MOUTH DAILY, Disp: 90 tablet, Rfl: 3  Allergies  Allergen Reactions  . Almond Oil Other (See Comments)    GI UPSET  . Carrot [Daucus Carota] Other (See Comments)    GI UPSET    Objective:   VITALS: Per patient if applicable, see vitals. GENERAL: Alert, appears well and in no acute distress. HEENT: Atraumatic, conjunctiva clear, no obvious abnormalities on inspection of external nose and ears. NECK: Normal movements of the head and neck. CARDIOPULMONARY: No increased WOB. Speaking  in clear sentences. I:E ratio WNL.  MS: Moves all visible extremities without noticeable abnormality. PSYCH: Pleasant and cooperative, well-groomed. Speech normal rate and rhythm. Affect is appropriate. Insight and judgement are appropriate. Attention is focused, linear, and appropriate.  NEURO: CN grossly intact. Oriented as arrived to appointment on time with no prompting. Moves both UE equally.  SKIN: No obvious lesions, wounds, erythema, or cyanosis noted on face or hands.  Assessment and Plan:   [name] was seen today for covid-19 testing.  Diagnoses and all orders for this visit:  Close  Exposure to Covid-19 Virus -     Novel Coronavirus, NAA ([facility])  Advice Given About Covid-19 Virus Infection    Currently asymptomatic. We are going to send patient for drive-up testing. As a precaution, they have been advised to remain home until COVID-19 results and then possible further quarantine after that based on results and symptoms. Advised if they experience a "second sickening" or worsening symptoms as the illness progresses, they are to call the office for further instructions or seek emergent evaluation for any severe symptoms.   . Reviewed expectations re: course of current medical issues. . Discussed self-management of symptoms. . Outlined signs and symptoms indicating need for more acute intervention. . Patient verbalized understanding and all questions were answered. Marland Kitchen. Health Maintenance issues including appropriate healthy diet, exercise, and smoking avoidance were discussed with patient. . See orders for this visit as documented in the electronic medical record.  I discussed the assessment and treatment plan with the patient. The patient was provided an opportunity to ask questions and all were answered. The patient agreed with the plan and demonstrated an understanding of the instructions.   The patient was advised to call back or seek an in-person evaluation if the symptoms worsen or if the condition fails to improve as anticipated.   CMA or LPN served as scribe during this visit. History, Physical, and Plan performed by medical provider. The above documentation has been reviewed and is accurate and complete.   [place]ticaSamantha Jezelle Gullick, GeorgiaPA 01/11/2019

## 2019-01-12 ENCOUNTER — Other Ambulatory Visit: Payer: Self-pay

## 2019-01-12 DIAGNOSIS — R6889 Other general symptoms and signs: Secondary | ICD-10-CM | POA: Diagnosis not present

## 2019-01-12 DIAGNOSIS — Z20822 Contact with and (suspected) exposure to covid-19: Secondary | ICD-10-CM

## 2019-01-15 LAB — NOVEL CORONAVIRUS, NAA: SARS-CoV-2, NAA: NOT DETECTED

## 2019-06-01 ENCOUNTER — Other Ambulatory Visit: Payer: Self-pay

## 2019-06-02 DIAGNOSIS — H5211 Myopia, right eye: Secondary | ICD-10-CM | POA: Diagnosis not present

## 2019-06-02 DIAGNOSIS — H53022 Refractive amblyopia, left eye: Secondary | ICD-10-CM | POA: Diagnosis not present

## 2019-06-02 DIAGNOSIS — H52223 Regular astigmatism, bilateral: Secondary | ICD-10-CM | POA: Diagnosis not present

## 2019-06-02 DIAGNOSIS — H5202 Hypermetropia, left eye: Secondary | ICD-10-CM | POA: Diagnosis not present

## 2019-06-05 ENCOUNTER — Ambulatory Visit (INDEPENDENT_AMBULATORY_CARE_PROVIDER_SITE_OTHER): Payer: 59 | Admitting: Internal Medicine

## 2019-06-05 ENCOUNTER — Encounter: Payer: Self-pay | Admitting: Internal Medicine

## 2019-06-05 VITALS — BP 142/84 | HR 56 | Temp 98.5°F | Ht 63.0 in | Wt 182.4 lb

## 2019-06-05 DIAGNOSIS — E8881 Metabolic syndrome: Secondary | ICD-10-CM | POA: Diagnosis not present

## 2019-06-05 DIAGNOSIS — E781 Pure hyperglyceridemia: Secondary | ICD-10-CM | POA: Diagnosis not present

## 2019-06-05 DIAGNOSIS — E039 Hypothyroidism, unspecified: Secondary | ICD-10-CM | POA: Diagnosis not present

## 2019-06-05 DIAGNOSIS — E349 Endocrine disorder, unspecified: Secondary | ICD-10-CM

## 2019-06-05 LAB — COMPREHENSIVE METABOLIC PANEL
ALT: 23 U/L (ref 0–35)
AST: 19 U/L (ref 0–37)
Albumin: 4.5 g/dL (ref 3.5–5.2)
Alkaline Phosphatase: 64 U/L (ref 39–117)
BUN: 11 mg/dL (ref 6–23)
CO2: 27 mEq/L (ref 19–32)
Calcium: 9.7 mg/dL (ref 8.4–10.5)
Chloride: 104 mEq/L (ref 96–112)
Creatinine, Ser: 0.83 mg/dL (ref 0.40–1.20)
GFR: 86.96 mL/min (ref >60.00)
Glucose, Bld: 94 mg/dL (ref 70–99)
Potassium: 4.3 mEq/L (ref 3.5–5.1)
Sodium: 139 mEq/L (ref 135–145)
Total Bilirubin: 0.4 mg/dL (ref 0.2–1.2)
Total Protein: 7.9 g/dL (ref 6.0–8.3)

## 2019-06-05 LAB — LDL CHOLESTEROL, DIRECT: Direct LDL: 100 mg/dL

## 2019-06-05 LAB — LIPID PANEL
Cholesterol: 186 mg/dL (ref 0–200)
HDL: 39.3 mg/dL (ref >39.00)
NonHDL: 146.78
Total CHOL/HDL Ratio: 5
Triglycerides: 343 mg/dL — ABNORMAL HIGH (ref 0.0–149.0)
VLDL: 68.6 mg/dL — ABNORMAL HIGH (ref 0.0–40.0)

## 2019-06-05 LAB — T4, FREE: Free T4: 0.93 ng/dL (ref 0.60–1.60)

## 2019-06-05 LAB — TSH: TSH: 1.72 u[IU]/mL (ref 0.35–5.50)

## 2019-06-05 LAB — HEMOGLOBIN A1C: Hgb A1c MFr Bld: 5.3 % (ref 4.6–6.5)

## 2019-06-05 NOTE — Progress Notes (Signed)
Name: Pamela Bennett  MRN/ DOB: [id number], 2000/10/08    Age/ Sex: 18 y.o., female    PCP: Helane Rima, DO   Reason for Endocrinology Evaluation: Hypothyroidism      Date of Initial Endocrinology Evaluation: 06/06/2019     HPI: Ms. Pamela Bennett is a 18 y.o. female with a past medical history of Precocity, obesity , hashimoto's thyroiditis. . The patient presented for initial endocrinology clinic visit on 06/06/2019 for consultative assistance with her hypothyroidism and elevated free testosterone    Pt had been evaluated by Endocrinology ( Dr. Fransico Michael ) for precocious puberty that was attributed to obesity requiring GnRh inhibitor implant. Pt became hypothyroid in 2007 requiring LT-4 initiation . Historically she has had issues with compliance but has been doing better lately. She takes it appropriately . 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 Menarche at 89. Menstruation has been regular over the years. No hirsutism. No acne per se except when she started on the marks.   She was diagnosed with insulin resistance and has been   on Metformin . She is tolerating well with no nausea or diarrhea.     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   She is adopted   HISTORY:  Past Medical History:  Past Medical History:  Diagnosis Date  . Hypothyroidism   . Ligament tear 02/2015   right small finger RCL tear   Past Surgical History:  Past Surgical History:  Procedure Laterality Date  . CLOSED REDUCTION RADIAL / ULNAR SHAFT FRACTURE Left 2004  . LIGAMENT REPAIR Right 03/15/2015   Procedure: RIGHT SMALL FINGER RADIAL COLLATERAL LIGAMENT RECONSTRUCTION ;  Surgeon: Dominica Severin, MD;  Location: [facility];  Service: Orthopedics;  Laterality: Right;  . PROXIMAL INTERPHALANGEAL  FUSION (PIP) Right 03/15/2015   Procedure: RIGHT PROXIMAL INTERPHALANGEAL FUSION (PIP) JOINT WITH PALMARIS GRAFT;  Surgeon: Dominica Severin, MD;  Location: [facility];  Service: Orthopedics;  Laterality: Right;  . SUPPRELIN IMPLANT Left 10/14/2009   upper arm      Social History:  reports that she has never smoked. She has never used smokeless tobacco. She reports that she does not drink alcohol or use drugs.  Family History: family history is not on file. She was adopted.   HOME MEDICATIONS: Allergies as of 06/05/2019      Reactions   Almond Oil Other (See Comments)   GI UPSET   Carrot [daucus Carota] Other (See Comments)   GI UPSET      Medication List       Accurate as of June 05, 2019 11:59 PM. If you have any questions, ask your nurse or doctor.        metFORMIN 500 MG tablet Commonly known as: GLUCOPHAGE TAKE 1 TABLET (500 MG TOTAL) BY MOUTH 2 TIMES DAILY WITH A MEAL.   rosuvastatin 10 MG tablet Commonly known as: Crestor Take 1 tablet (10 mg total) by mouth daily.   Synthroid 25 MCG tablet Generic drug: levothyroxine TAKE 1 TABLET BY MOUTH DAILY         REVIEW OF SYSTEMS: A comprehensive ROS was conducted with the patient and is negative except as per HPI and below:  ROS     OBJECTIVE:  VS: BP (!) 142/84 (BP Location: Left Arm, Patient Position: Sitting, Cuff  Size: Normal)   Pulse (!) 56   Temp 98.5 F (36.9 C)   Ht 5\' 3"  (1.6 m)   Wt 182 lb 6.4 oz (82.7 kg)   SpO2 97%   BMI 32.31 kg/m    Wt Readings from Last 3 Encounters:  06/05/19 182 lb 6.4 oz (82.7 kg)  12/26/18 179 lb (81.2 kg)  06/27/18 179 lb 9.6 oz (81.5 kg) (94 %, Z= 1.59)*   * Growth percentiles are based on CDC (Girls, 2-20 Years) data.     EXAM: General: Pt appears well and is in NAD  Hydration: Well-hydrated with moist mucous membranes and good skin turgor  Eyes: External eye exam normal without stare, lid lag or exophthalmos.  EOM intact.   Ears, Nose,  Throat: Hearing: Grossly intact bilaterally Dental: Good dentition  Throat: Clear without mass, erythema or exudate  Neck: General: Supple without adenopathy. Thyroid: Thyroid size normal.  No goiter or nodules appreciated. No thyroid bruit.  Lungs: Clear with good BS bilat with no rales, rhonchi, or wheezes  Heart: Auscultation: RRR.  Abdomen: Normoactive bowel sounds, soft, nontender, without masses or organomegaly palpable  Extremities:  BL LE: No pretibial edema normal ROM and strength.  Skin: Hair: Texture and amount normal with gender appropriate distribution Skin Inspection: No rashes Skin Palpation: Skin temperature, texture, and thickness normal to palpation  Neuro: Cranial nerves: II - XII grossly intact  Motor: Normal strength throughout DTRs: 2+ and symmetric in UE without delay in relaxation phase  Mental Status: Judgment, insight: Intact Orientation: Oriented to time, place, and person Mood and affect: No depression, anxiety, or agitation     DATA REVIEWED: Results for Pamela Bennett, Pamela Bennett (MRN [id number]) as of 06/06/2019 07:51  Ref. Range 06/05/2019 10:09  Sodium Latest Ref Range: 135 - 145 mEq/L 139  Potassium Latest Ref Range: 3.5 - 5.1 mEq/L 4.3  Chloride Latest Ref Range: 96 - 112 mEq/L 104  CO2 Latest Ref Range: 19 - 32 mEq/L 27  Glucose Latest Ref Range: 70 - 99 mg/dL 94  BUN Latest Ref Range: 6 - 23 mg/dL 11  Creatinine Latest Ref Range: 0.40 - 1.20 mg/dL 4.090.83  Calcium Latest Ref Range: 8.4 - 10.5 mg/dL 9.7  Alkaline Phosphatase Latest Ref Range: 39 - 117 U/L 64  Albumin Latest Ref Range: 3.5 - 5.2 g/dL 4.5  AST Latest Ref Range: 0 - 37 U/L 19  ALT Latest Ref Range: 0 - 35 U/L 23  Total Protein Latest Ref Range: 6.0 - 8.3 g/dL 7.9  Total Bilirubin Latest Ref Range: 0.2 - 1.2 mg/dL 0.4  GFR Latest Ref Range: >60.00 mL/min 86.96  Total CHOL/HDL Ratio Unknown 5  Cholesterol Latest Ref Range: 0 - 200 mg/dL 811186  HDL Cholesterol Latest Ref Range: >39.00  mg/dL 91.4739.30  Direct LDL Latest Units: mg/dL 829.5100.0  NonHDL Unknown [phone]  Triglycerides Latest Ref Range: 0.0 - 149.0 mg/dL [phone] (H)  VLDL Latest Ref Range: 0.0 - 40.0 mg/dL 84.668.6 (H)  Hemoglobin N6EA1C Latest Ref Range: 4.6 - 6.5 % 5.3  TSH Latest Ref Range: 0.35 - 5.50 uIU/mL 1.72  T4,Free(Direct) Latest Ref Range: 0.60 - 1.60 ng/dL 9.520.93      ASSESSMENT/PLAN/RECOMMENDATIONS:   1. Hypothyroidism:   - She is clinically and biochemically euthyroid  - No local neck symptoms - Pt educated extensively on the correct way to take levothyroxine (first thing in the morning with water, 30 minutes before eating or taking other medications). - Pt encouraged to double dose the following  day if she were to miss a dose given long half-life of levothyroxine.   Medications : Levothyroxine 25 mcg daily    2. Insulin Resistance:  - We discussed low carb diet, avoiding snacks when possible and eating 2-3 meals daily as well as continuing with lifestyle changes  - A1c at 5.3% but this is with 1000 mg of Metformin daily  - No side effects to Metformin, will continue    Medications :  Metformin 500 mg XR BID   3. 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 She denies any irregular periods or hirsutism. She does have mild acne that she attributes to mask wearing, this is not bothersome to her at this time.  - No indication to treat since no bothersome symptoms of hyperandrogenism   4. Hypertriglyceridemia:   - There's probably a hereditary component with a Tg max of  519.0 mg/dL  - The fact that it was 136 mg/dL back in 11/2018 and now at 343 mg/dL indicated contributary dietary factors. A portal message was sent advising the patient about low fat diet and offering her a RD referral if needed  - She is tolerating Crestor 10 mg daily, she was advised to discontinue this when she plans to conceive as statins are  contra-indicated during pregnancy.    F/U in 6 months     Signed electronically by: Mack Guise, MD  [facility]  [facility] [address] [place], [place] [address] Phone: [phone] FAX: [phone]   CC: [address], [address] [place] [address] Phone: [phone] Fax: [phone]   Return to Endocrinology clinic as below: Future Appointments  Date Time Provider [place]  12/05/2019  9:10 AM Lajuan Kovaleski, Melanie Crazier, MD [id number] None

## 2019-06-05 NOTE — Patient Instructions (Signed)
You are on levothyroxine - which is your thyroid hormone supplement. You MUST take this consistently.  You should take this first thing in the morning on an empty stomach with water. You should not take it with other medications. Wait 49min to 1hr prior to eating. If you are taking any vitamins - please take these in the evening.   If you miss a dose, please take your missed dose the following day (double the dose for that day). You should have a pill box for ONLY levothyroxine on your bedside table to help you remember to take your medications.   Please stop Rosuvastatin when planning to get  Pregnant. Rosuvastatin is contraindicated during pregnancy.

## 2019-06-06 ENCOUNTER — Encounter: Payer: Self-pay | Admitting: Internal Medicine

## 2019-06-13 MED FILL — ROSUVASTATIN CALCIUM 10 MG: 10 | 90 days supply | Qty: 90 | Fill #2

## 2019-06-28 ENCOUNTER — Other Ambulatory Visit: Payer: Self-pay

## 2019-06-29 ENCOUNTER — Ambulatory Visit (INDEPENDENT_AMBULATORY_CARE_PROVIDER_SITE_OTHER): Payer: 59 | Admitting: Physician Assistant

## 2019-06-29 ENCOUNTER — Encounter: Payer: Self-pay | Admitting: Physician Assistant

## 2019-06-29 VITALS — BP 110/72 | HR 51 | Temp 98.2°F | Ht 63.0 in | Wt 179.2 lb

## 2019-06-29 DIAGNOSIS — E782 Mixed hyperlipidemia: Secondary | ICD-10-CM | POA: Diagnosis not present

## 2019-06-29 DIAGNOSIS — Z30011 Encounter for initial prescription of contraceptive pills: Secondary | ICD-10-CM | POA: Diagnosis not present

## 2019-06-29 DIAGNOSIS — L700 Acne vulgaris: Secondary | ICD-10-CM | POA: Diagnosis not present

## 2019-06-29 LAB — POCT URINE PREGNANCY: Preg Test, Ur: NEGATIVE

## 2019-06-29 MED ORDER — CLINDAMYCIN PHOS-BENZOYL PEROX 1-5 % EX GEL: Freq: Two times a day (BID) | CUTANEOUS | 0 refills | Status: DC

## 2019-06-29 MED ORDER — NORGESTIMATE-ETH ESTRADIOL 0.25-35 MG-MCG PO TABS: 1.0000 | ORAL_TABLET | Freq: Every day | ORAL | 11 refills | Status: DC

## 2019-06-29 MED FILL — CLINDAMYCIN PHOS-BENZOYL PE: 1-5 | 30 days supply | Qty: 25 | Fill #0

## 2019-06-29 MED FILL — FEMYNOR 0.25-35 MG-MCG TABS: 0.25-35 | 28 days supply | Qty: 28 | Fill #0

## 2019-06-29 NOTE — Progress Notes (Signed)
Pamela Bennett is a 19 y.o. female is here for Transfer of care.  I acted as a Neurosurgeon for [employer], PA-C Corky Mull, LPN  History of Present Illness:   Chief Complaint  Patient presents with  . Transfer of care    from Dr. Earlene Plater  . Contraception  . Acne    HPI   Pt is here today for transfer of care from Dr. Earlene Plater.  Oral contraceptives Pt would like to discuss going on birth control pills. Endocrinologist recommended it to help decrease testosterone level. Periods are about 3 days of bleeding with 1-2 days of spotting, not too painful except on day 1. She is sexually active. Patient's last menstrual period was 06/05/2019 (approximate). Denies smoking or prior hx of clotting disorder.    Tobacco Use: Low Risk   . Smoking Tobacco Use: Never Smoker  . Smokeless Tobacco Use: Never Used     Acne Pt would like to discuss medication for acne on face. Started when she had to wear a mask. She has tried OTC products but still having problems. Currently uses Cerave moisturizer with a salicylic acid OTC treatment.  HLD Currently on 10 mg Crestor. Triglycerides were initially >500 when she started on this, most recently in 300's. She tolerates this medication well. She knows that this medication is contraindicated if she were to become pregnant.  Patient's last menstrual period was 06/05/2019 (approximate).   Health Maintenance Due  Topic Date Due  . INFLUENZA VACCINE  01/21/2019    Past Medical History:  Diagnosis Date  . Hypothyroidism   . Ligament tear 02/2015   right small finger RCL tear     Social History   Socioeconomic History  . Marital status: Single    Spouse name: Not on file  . Number of children: Not on file  . Years of education: Not on file  . Highest education level: Not on file  Occupational History  . Not on file  Tobacco Use  . Smoking status: Never Smoker  . Smokeless tobacco: Never Used  Substance and Sexual Activity   . Alcohol use: No  . Drug use: No  . Sexual activity: Never  Other Topics Concern  . 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       [name] is currently in college at [employer].  She is pursuing a degree in occupational therapy.  She exercises regularly and has enrolled in school basketball.  Her diet is irregular, as she usually sleeps through breakfast.  She states that she has 2 healthy meals per day.  She denies any ongoing weight gain or weight loss.  She does live on campus.  She is not sexually active.  She prefers men.  No history of STI's.  No alcohol or tobacco use.  No drug use.   Social Determinants of Health   Financial Resource Strain:   . Difficulty of Paying Living Expenses: Not on file  Food Insecurity:   . Worried About Programme researcher, broadcasting/film/video in the Last Year: Not on file  . Ran Out of Food in the Last Year: Not on file  Transportation Needs:   . Lack of Transportation (Medical): Not on file  . Lack of Transportation (Non-Medical): Not on file  Physical Activity:   . Days of Exercise per Week: Not on file  . Minutes of Exercise per Session: Not on file  Stress:   .  Feeling of Stress : Not on file  Social Connections:   . Frequency of Communication with Friends and Family: Not on file  . Frequency of Social Gatherings with Friends and Family: Not on file  . Attends Religious Services: Not on file  . Active Member of Clubs or Organizations: Not on file  . Attends Banker Meetings: Not on file  . Marital Status: Not on file  Intimate Partner Violence:   . Fear of Current or Ex-Partner: Not on file  . Emotionally Abused: Not on file  . Physically Abused: Not on file  . Sexually Abused: Not on file    Past Surgical History:  Procedure Laterality Date  . CLOSED REDUCTION RADIAL / ULNAR SHAFT FRACTURE Left 2004  . LIGAMENT REPAIR Right 03/15/2015   Procedure: RIGHT SMALL FINGER  RADIAL COLLATERAL LIGAMENT RECONSTRUCTION ;  Surgeon: Dominica Severin, MD;  Location: [name];  Service: Orthopedics;  Laterality: Right;  . PROXIMAL INTERPHALANGEAL FUSION (PIP) Right 03/15/2015   Procedure: RIGHT PROXIMAL INTERPHALANGEAL FUSION (PIP) JOINT WITH PALMARIS GRAFT;  Surgeon: Dominica Severin, MD;  Location: [facility];  Service: Orthopedics;  Laterality: Right;  . SUPPRELIN IMPLANT Left 10/14/2009   upper arm    Family History  Adopted: Yes    PMHx, SurgHx, SocialHx, FamHx, Medications, and Allergies were reviewed in the Visit Navigator and updated as appropriate.   Patient Active Problem List   Diagnosis Date Noted  . 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 Regular astigmatism of both eyes 06/08/2017  . 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 Thyroiditis, autoimmune 11/21/2012  . Osgood-Schlatter's disease 02/10/2012  . Goiter   . Hypothyroidism, acquired, autoimmune   . Short stature   . Dyspepsia   . Obesity   . Precocious sexual development and puberty 10/15/2010    Social History   Tobacco Use  . Smoking status: Never Smoker  . Smokeless tobacco: Never Used  Substance Use Topics  . Alcohol use: No  . Drug use: No    Current Medications and Allergies:    Current Outpatient Medications:  .  metFORMIN (GLUCOPHAGE) 500 MG tablet, TAKE 1 TABLET (500 MG TOTAL) BY MOUTH 2 TIMES DAILY WITH A MEAL., Disp: 60 tablet, Rfl: 6 .  rosuvastatin (CRESTOR) 10 MG tablet, Take 1 tablet (10 mg total) by mouth daily., Disp: 90 tablet, Rfl: 3 .  SYNTHROID 25 MCG tablet, TAKE 1 TABLET BY MOUTH DAILY, Disp: 90 tablet, Rfl: 3 .  clindamycin-benzoyl peroxide (BENZACLIN) gel, Apply topically 2 (two) times daily., Disp: 25 g, Rfl: 0 .  norgestimate-ethinyl estradiol (SPRINTEC 28) 0.25-35 MG-MCG tablet, Take 1 tablet by mouth daily., Disp: 1 Package, Rfl: 11   Allergies  Allergen Reactions  . Almond Oil Other (See Comments)    GI UPSET  .  Carrot [Daucus Carota] Other (See Comments)    GI UPSET    Review of Systems   ROS  Negative unless otherwise specified per HPI.  Vitals:   Vitals:   06/29/19 1102  BP: 110/72  Pulse: (!) 51  Temp: 98.2 F (36.8 C)  TempSrc: Temporal  SpO2: 97%  Weight: 179 lb 4 oz (81.3 kg)  Height: 5\' 3"  (1.6 m)     Body mass index is 31.75 kg/m.   Physical Exam:    Physical Exam Vitals and nursing note reviewed.  Constitutional:      General: She is not in acute distress.    Appearance: She is well-developed. She is not ill-appearing or  toxic-appearing.  Cardiovascular:     Rate and Rhythm: Normal rate and regular rhythm.     Pulses: Normal pulses.     Heart sounds: Normal heart sounds, S1 normal and S2 normal.     Comments: No LE edema Pulmonary:     Effort: Pulmonary effort is normal.     Breath sounds: Normal breath sounds.  Skin:    General: Skin is warm and dry.     Comments: Multiple pustules scattered on b/l cheeks and temporal area  Neurological:     Mental Status: She is alert.     GCS: GCS eye subscore is 4. GCS verbal subscore is 5. GCS motor subscore is 6.  Psychiatric:        Speech: Speech normal.        Behavior: Behavior normal. Behavior is cooperative.    Results for orders placed or performed in visit on 06/29/19  POCT urine pregnancy  Result Value Ref Range   Preg Test, Ur Negative Negative      Assessment and Plan:    Yelina was seen today for transfer of care, contraception and acne.  Diagnoses and all orders for this visit:  Oral contraception initiation Urine preg negative. Start Sprintec. Discussion of oral contraceptives today included possible side effects including regular spotting, leg cramps, weight gain, headaches, nausea, breast tenderness, DVT/PE. She does not have contraindications to use of oral contraceptives; she denies diabetes, family history of thromboembolic disease, breast or endometrial cancer, hx of DVT, hx of  phlebitis, prior pulmonary embolism, HTN, migraines, age over 28 years, tobacco use or unexplained vaginal bleeding. -     POCT urine pregnancy  Mixed hyperlipidemia Agree with Crestor 10 mg. She knows to stop this medication if pregnancy desired. Repeat lipids in 6 months.  Acne vulgaris Discussed mask hygiene. Will trial topical benzaclin. Follow-up in 6 months, sooner if concerns.  Other orders -     norgestimate-ethinyl estradiol ([place]eva 28) 0.25-35 MG-MCG tablet; Take 1 tablet by mouth daily. -     clindamycin-benzoyl peroxide (BENZACLIN) gel; Apply topically 2 (two) times daily.    . Reviewed expectations re: course of current medical issues. . Discussed self-management of symptoms. . Outlined signs and symptoms indicating need for more acute intervention. . Patient verbalized understanding and all questions were answered. . See orders for this visit as documented in the electronic medical record. . Patient received an After Visit Summary.  CMA or LPN served as scribe during this visit. History, Physical, and Plan performed by medical provider. The above documentation has been reviewed and is accurate and complete.   Inda Coke, PA-C [place], [place] 06/29/2019  Follow-up: No follow-ups on file.

## 2019-06-29 NOTE — Patient Instructions (Signed)
It was great to see you!  Please start oral contraceptives and use acne gel as needed.  Let's follow-up in 6 months to see how you are doing on your regimen, sooner if you have concerns.  Take care,  Jarold Motto PA-C

## 2019-07-11 DIAGNOSIS — Z6832 Body mass index (BMI) 32.0-32.9, adult: Secondary | ICD-10-CM | POA: Diagnosis not present

## 2019-07-11 DIAGNOSIS — M62830 Muscle spasm of back: Secondary | ICD-10-CM | POA: Diagnosis not present

## 2019-07-11 DIAGNOSIS — E669 Obesity, unspecified: Secondary | ICD-10-CM | POA: Diagnosis not present

## 2019-07-11 DIAGNOSIS — S39012A Strain of muscle, fascia and tendon of lower back, initial encounter: Secondary | ICD-10-CM | POA: Diagnosis not present

## 2019-07-24 ENCOUNTER — Encounter: Payer: Self-pay | Admitting: Physician Assistant

## 2019-07-24 ENCOUNTER — Ambulatory Visit (INDEPENDENT_AMBULATORY_CARE_PROVIDER_SITE_OTHER): Payer: 59 | Admitting: Physician Assistant

## 2019-07-24 DIAGNOSIS — R21 Rash and other nonspecific skin eruption: Secondary | ICD-10-CM

## 2019-07-24 DIAGNOSIS — U071 COVID-19: Secondary | ICD-10-CM | POA: Diagnosis not present

## 2019-07-24 MED ORDER — NORGESTIMATE-ETH ESTRADIOL 0.25-35 MG-MCG PO TABS: 1.0000 | ORAL_TABLET | Freq: Every day | ORAL | 11 refills | Status: DC

## 2019-07-24 MED ORDER — EPINEPHRINE 0.3 MG/0.3ML IJ SOAJ: 0.3000 mg | INTRAMUSCULAR | 0 refills | Status: DC | PRN

## 2019-07-24 NOTE — Telephone Encounter (Signed)
Please call pt and schedule a virtual visit or she can go to Urgent care. We can not prescribe anything without her being seen.

## 2019-07-24 NOTE — Progress Notes (Signed)
Virtual Visit via Video   I connected with Pamela Bennett on 07/24/19 at  3:40 PM EST by a video enabled telemedicine application and verified that I am speaking with the correct person using two identifiers. Location patient: Home Location provider: [facility], Office Persons participating in the virtual visit: Pamela Bennett, [place] PA-C, Pamela Mull, LPN   I discussed the limitations of evaluation and management by telemedicine and the availability of in person appointments. The patient expressed understanding and agreed to proceed.  I acted as a Neurosurgeon for [employer], PA-C Pamela Mull, LPN   Subjective:   HPI:   Symptom onset: Tuesday 01/26  Travel/contacts: No travel or exposure that she is aware of.   Pt tested positive for COVID-19 on 07/21/2019.  Patient endorses the following symptoms: sinus congestion, rhinorrhea, sore throat, productive cough (expectorating white sputum) and myalgia; has had some swelling in some of her fingers, rash with itching to b/l legs  Patient denies the following symptoms: Fever (none), sinus headache, ear pain, wheezing, shortness of breath, chest tightness and chest pain; lip/airway swelling  Treatments tried: Mucinex, Tylenol and Aspirin 325 mg x 1 to prevent blood clots; Women's VitaFusion Chewable MVI, Elderberry, Vitamin C, Zinc, oral prednisone (had some leftover from prior rx)  Patient risk factors: Current COVID-19 risk of complications score: 1 Smoking status: Pamela Bennett  reports that she has never smoked. She has never used smokeless tobacco. If female, currently pregnant? []   Yes [x]   No  ROS: See pertinent positives and negatives per HPI.  Patient Active Problem List   Diagnosis Date Noted  . 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 Regular astigmatism of both eyes 06/08/2017  . 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 Thyroiditis, autoimmune 11/21/2012  .  Osgood-Schlatter's disease 02/10/2012  . Goiter   . Hypothyroidism, acquired, autoimmune   . Short stature   . Dyspepsia   . Obesity   . Precocious sexual development and puberty 10/15/2010    Social History   Tobacco Use  . Smoking status: Never Smoker  . Smokeless tobacco: Never Used  Substance Use Topics  . Alcohol use: No    Current Outpatient Medications:  .  clindamycin-benzoyl peroxide (BENZACLIN) gel, Apply topically 2 (two) times daily., Disp: 25 g, Rfl: 0 .  metFORMIN (GLUCOPHAGE) 500 MG tablet, TAKE 1 TABLET (500 MG TOTAL) BY MOUTH 2 TIMES DAILY WITH A MEAL., Disp: 60 tablet, Rfl: 6 .  norgestimate-ethinyl estradiol (SPRINTEC 28) 0.25-35 MG-MCG tablet, Take 1 tablet by mouth daily., Disp: 1 Package, Rfl: 11 .  rosuvastatin (CRESTOR) 10 MG tablet, Take 1 tablet (10 mg total) by mouth daily., Disp: 90 tablet, Rfl: 3 .  SYNTHROID 25 MCG tablet, TAKE 1 TABLET BY MOUTH DAILY, Disp: 90 tablet, Rfl: 3 .  EPINEPHrine (EPIPEN 2-PAK) 0.3 mg/0.3 mL IJ SOAJ injection, Inject 0.3 mLs (0.3 mg total) into the muscle as needed for anaphylaxis., Disp: 2 each, Rfl: 0  Allergies  Allergen Reactions  . Almond Oil Other (See Comments)    GI UPSET  . Carrot [Daucus Carota] Other (See Comments)    GI UPSET    Objective:   VITALS: Per patient if applicable, see vitals. GENERAL: Alert, appears well and in no acute distress. HEENT: Atraumatic, conjunctiva clear, no obvious abnormalities on inspection of external nose and ears. NECK: Normal movements of the head and neck. CARDIOPULMONARY: No increased WOB. Speaking in clear sentences. I:E ratio WNL.  MS: Moves all visible extremities without noticeable  abnormality. PSYCH: Pleasant and cooperative, well-groomed. Speech normal rate and rhythm. Affect is appropriate. Insight and judgement are appropriate. Attention is focused, linear, and appropriate.  NEURO: CN grossly intact. Oriented as arrived to appointment on time with no prompting.  Moves both UE equally.  SKIN: No obvious lesions, wounds, erythema, or cyanosis noted on face or hands.  Assessment and Plan:   Pamela Bennett was seen today for covid symptoms.  Diagnoses and all orders for this visit:  COVID-19 Patient has a respiratory illness without signs of acute distress or respiratory compromise at this time. Advised if they experience a "second sickening" or worsening symptoms as the illness progresses, they are to call the office for further instructions or seek emergent evaluation for any severe symptoms.  Rash Unclear etiology. Sounds like she had a possible reaction to one of the supplements she is taking or the food she had last night. Recommended avoidance of supplements for now, continue antihistamine of choice, epipen sent for her to have on hand. We reviewed worsening precautions. She does have some oral prednisone on hand should her symptoms worsen.  Other orders -     norgestimate-ethinyl estradiol ([place] 28) 0.25-35 MG-MCG tablet; Take 1 tablet by mouth daily. -     EPINEPHrine (EPIPEN 2-PAK) 0.3 mg/0.3 mL IJ SOAJ injection; Inject 0.3 mLs (0.3 mg total) into the muscle as needed for anaphylaxis.  . Reviewed expectations re: course of current medical issues. . Discussed self-management of symptoms. . Outlined signs and symptoms indicating need for more acute intervention. . Patient verbalized understanding and all questions were answered. Marland Kitchen Health Maintenance issues including appropriate healthy diet, exercise, and smoking avoidance were discussed with patient. . See orders for this visit as documented in the electronic medical record.  I discussed the assessment and treatment plan with the patient. The patient was provided an opportunity to ask questions and all were answered. The patient agreed with the plan and demonstrated an understanding of the instructions.   The patient was advised to call back or seek an in-person evaluation if the symptoms  worsen or if the condition fails to improve as anticipated.   CMA or LPN served as scribe during this visit. History, Physical, and Plan performed by medical provider. The above documentation has been reviewed and is accurate and complete.  [place], [place] 07/24/2019

## 2019-07-25 ENCOUNTER — Other Ambulatory Visit: Payer: Self-pay | Admitting: Physician Assistant

## 2019-07-25 ENCOUNTER — Encounter: Payer: Self-pay | Admitting: Physician Assistant

## 2019-07-25 MED ORDER — PREDNISONE 20 MG PO TABS: 40.0000 mg | ORAL_TABLET | Freq: Every day | ORAL | 0 refills | Status: DC

## 2019-09-19 DIAGNOSIS — L509 Urticaria, unspecified: Secondary | ICD-10-CM | POA: Diagnosis not present

## 2019-09-19 DIAGNOSIS — J302 Other seasonal allergic rhinitis: Secondary | ICD-10-CM | POA: Diagnosis not present

## 2019-09-19 DIAGNOSIS — Z91018 Allergy to other foods: Secondary | ICD-10-CM | POA: Diagnosis not present

## 2019-10-27 IMAGING — US US THYROID
1 series · 14 of 25 positions shown · non-contrast
Comparison: None.

CLINICAL DATA: Other.  Thyroiditis.

EXAM:
THYROID ULTRASOUND
TECHNIQUE: Ultrasound examination of the thyroid gland and adjacent soft
tissues was performed.

[Series 1: us thyroid · 0.07mm/px · 14 of 35 slices shown]
[im 1/35]
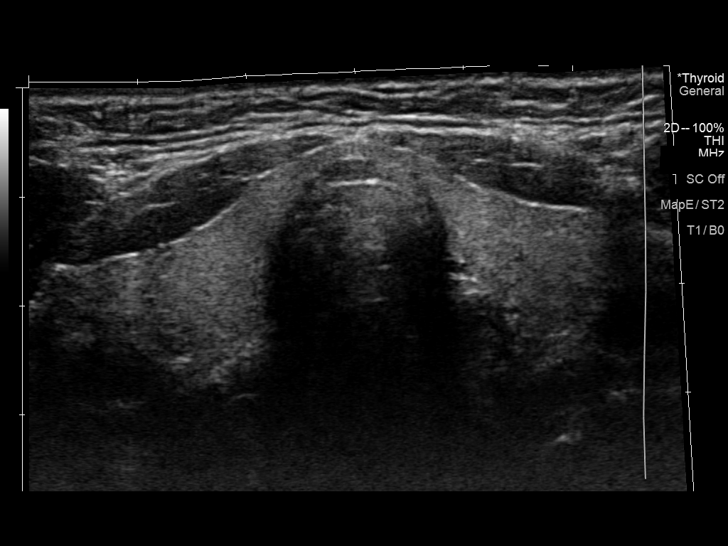
[im 3/35]
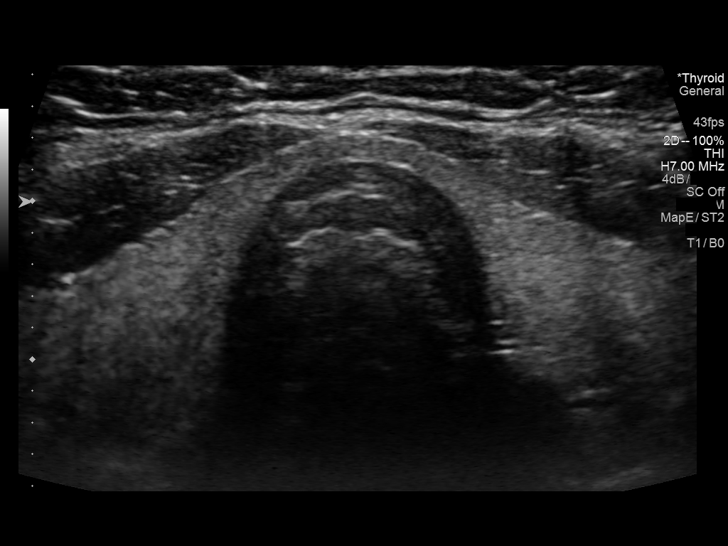
[im 6/35]
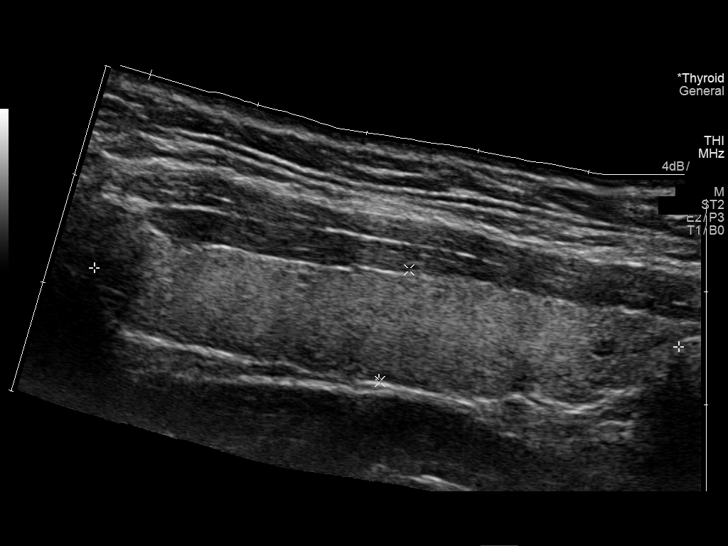
[im 9/35]
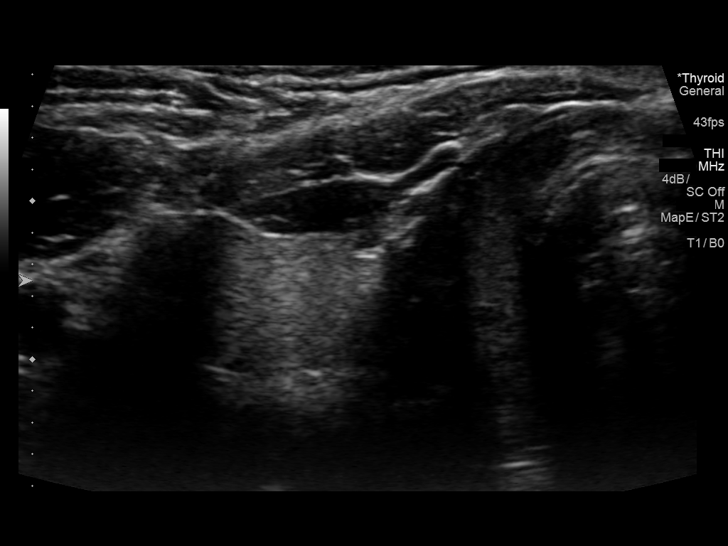
[im 12/35]
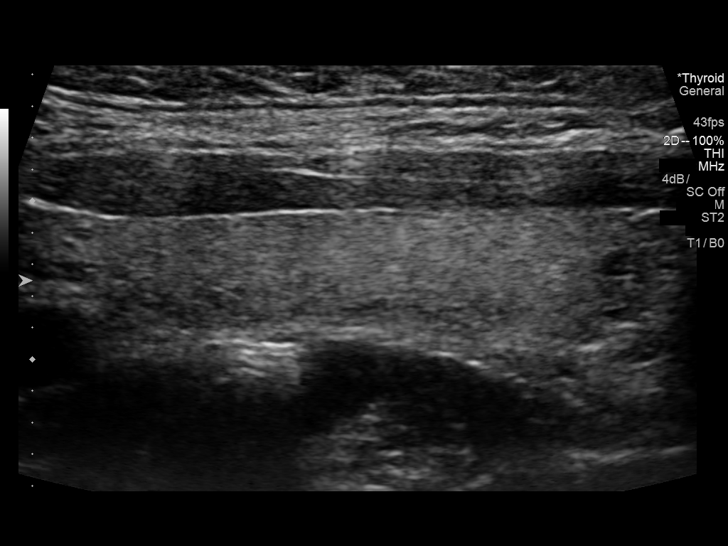
[im 13/35]
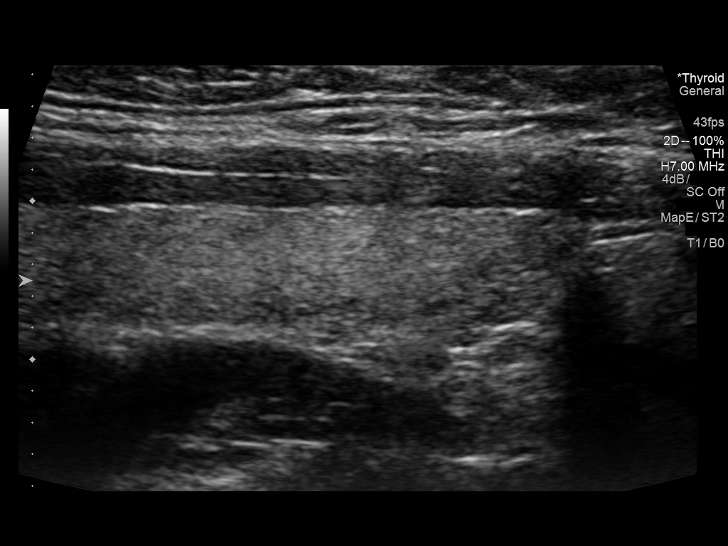
[im 16/35]
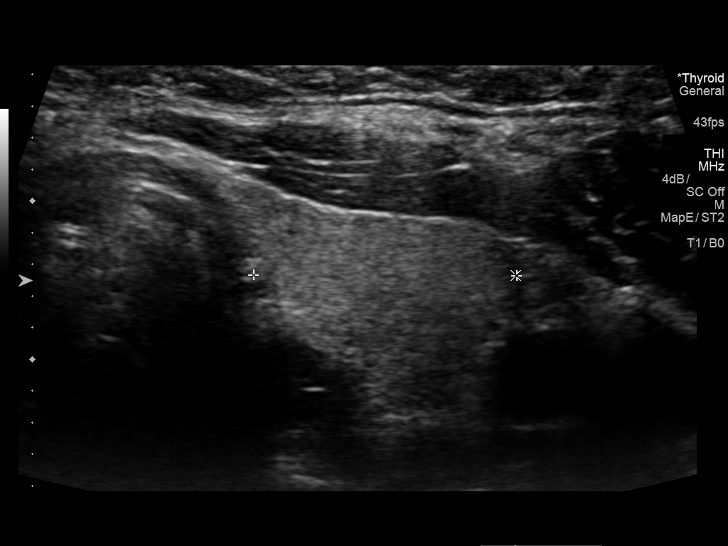
[im 19/35]
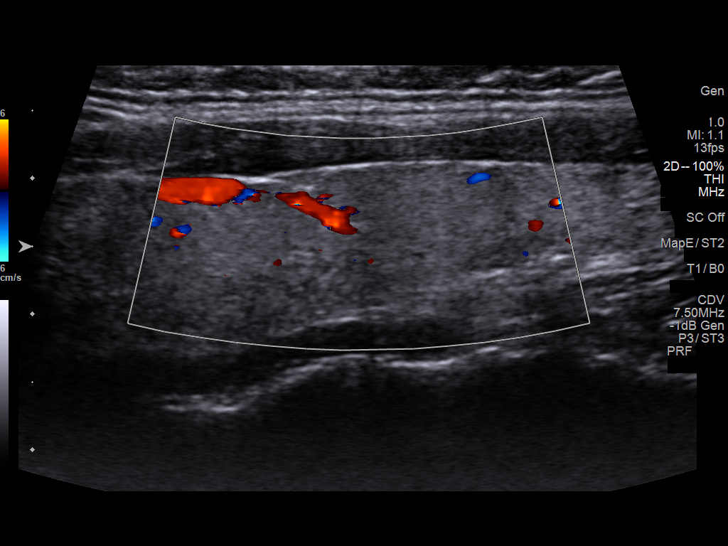
[im 22/35]
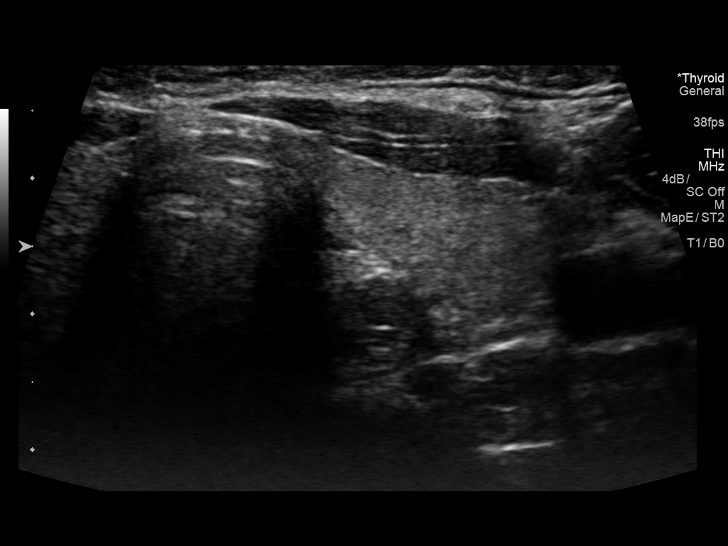
[im 23/35]
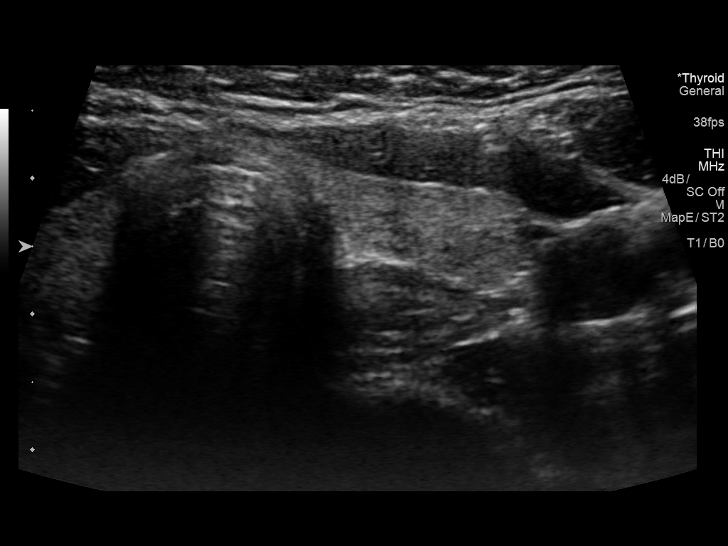
[im 26/35]
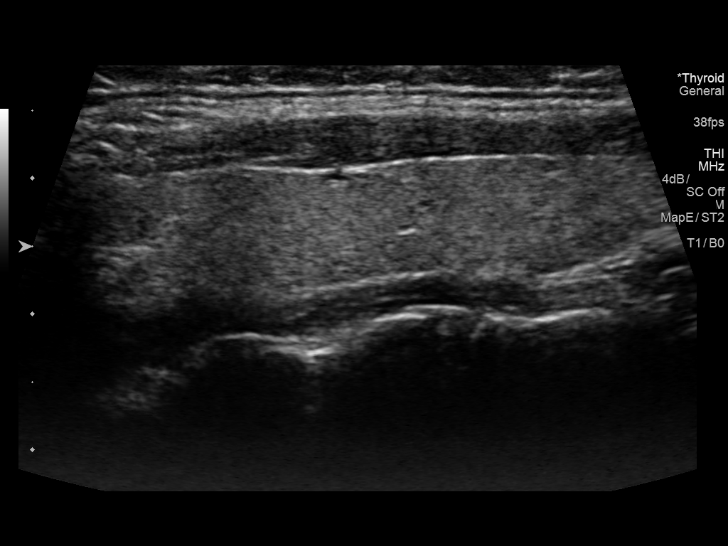
[im 29/35]
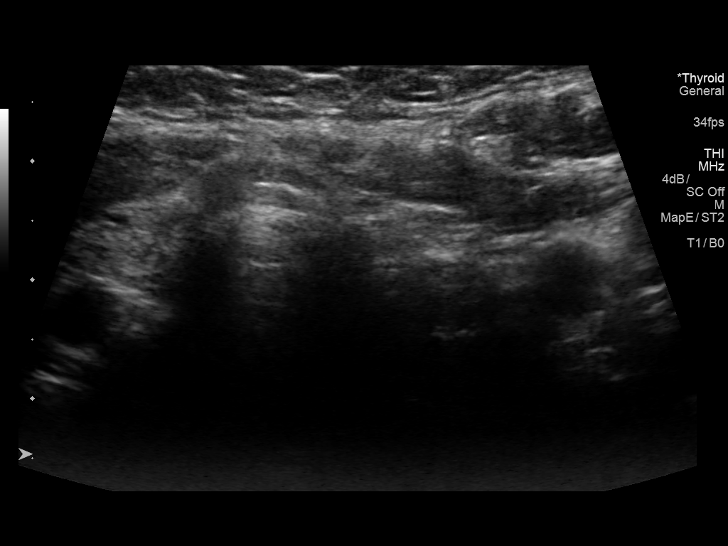
[im 32/35]
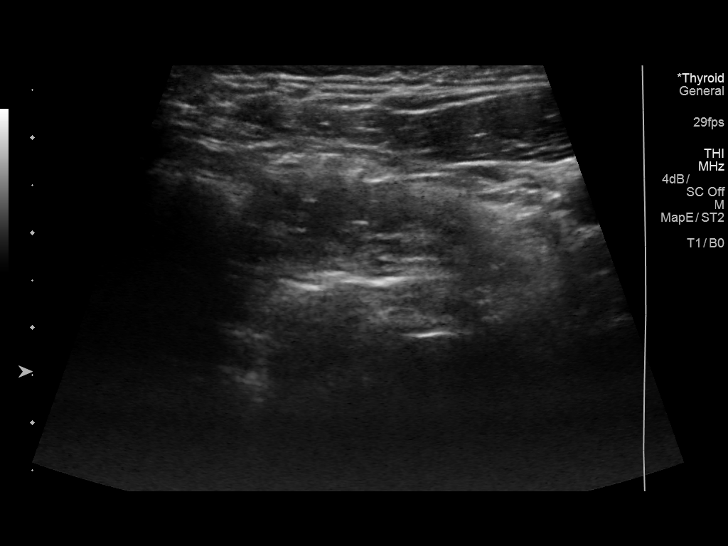
[im 35/35]
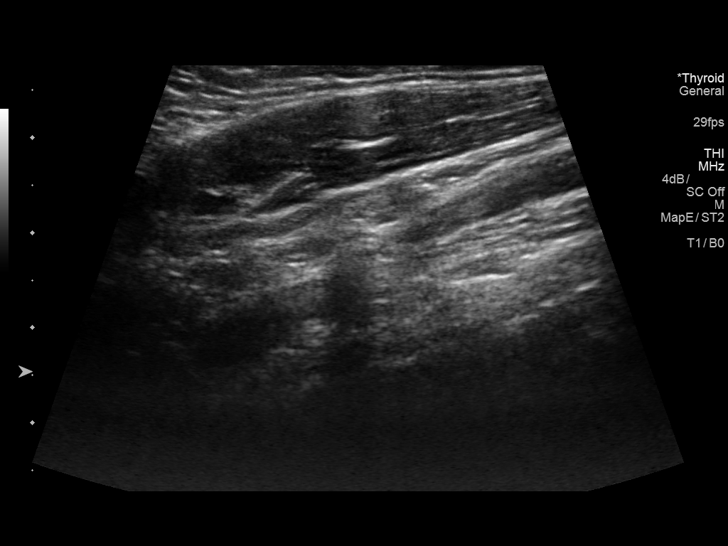

[14 of 25 positions shown; findings below may reference images not displayed]

FINDINGS: Parenchymal Echotexture: Normal

Isthmus: 0.2 cm

Right lobe: 5.2 x 1.0 x 1.9 cm

Left lobe: 5.4 x 1.1 x 1.7 cm

_________________________________________________________

Estimated total number of nodules >/= 1 cm: 0

Number of spongiform nodules >/=  2 cm not described below (TR1): 0

Number of mixed cystic and solid nodules >/= 1.5 cm not described
below (TR2): 0

_________________________________________________________

No discrete nodules are seen within the thyroid gland. No abnormal
lymph nodes identified.
IMPRESSION: Normal thyroid ultrasound.

The above is in keeping with the ACR TI-RADS recommendations - [HOSPITAL] 2764;[DATE].

## 2019-11-15 ENCOUNTER — Other Ambulatory Visit: Payer: Self-pay | Admitting: Internal Medicine

## 2019-11-15 ENCOUNTER — Ambulatory Visit (INDEPENDENT_AMBULATORY_CARE_PROVIDER_SITE_OTHER): Payer: 59 | Admitting: Internal Medicine

## 2019-11-15 ENCOUNTER — Encounter: Payer: Self-pay | Admitting: Internal Medicine

## 2019-11-15 ENCOUNTER — Other Ambulatory Visit: Payer: Self-pay

## 2019-11-15 VITALS — BP 142/88 | HR 63 | Temp 98.7°F | Ht 63.0 in | Wt 172.4 lb

## 2019-11-15 DIAGNOSIS — E349 Endocrine disorder, unspecified: Secondary | ICD-10-CM | POA: Diagnosis not present

## 2019-11-15 DIAGNOSIS — E8881 Metabolic syndrome: Secondary | ICD-10-CM | POA: Diagnosis not present

## 2019-11-15 DIAGNOSIS — E039 Hypothyroidism, unspecified: Secondary | ICD-10-CM

## 2019-11-15 DIAGNOSIS — E781 Pure hyperglyceridemia: Secondary | ICD-10-CM

## 2019-11-15 LAB — COMPREHENSIVE METABOLIC PANEL
ALT: 20 U/L (ref 0–35)
AST: 22 U/L (ref 0–37)
Albumin: 4.3 g/dL (ref 3.5–5.2)
Alkaline Phosphatase: 51 U/L (ref 39–117)
BUN: 11 mg/dL (ref 6–23)
CO2: 26 mEq/L (ref 19–32)
Calcium: 9.5 mg/dL (ref 8.4–10.5)
Chloride: 105 mEq/L (ref 96–112)
Creatinine, Ser: 0.87 mg/dL (ref 0.40–1.20)
GFR: 82.01 mL/min (ref >60.00)
Glucose, Bld: 96 mg/dL (ref 70–99)
Potassium: 4.6 mEq/L (ref 3.5–5.1)
Sodium: 137 mEq/L (ref 135–145)
Total Bilirubin: 0.3 mg/dL (ref 0.2–1.2)
Total Protein: 7.3 g/dL (ref 6.0–8.3)

## 2019-11-15 LAB — HEMOGLOBIN A1C: Hgb A1c MFr Bld: 5.6 % (ref 4.6–6.5)

## 2019-11-15 LAB — LIPID PANEL
Cholesterol: 198 mg/dL (ref 0–200)
HDL: 46.9 mg/dL (ref >39.00)
NonHDL: 150.96
Total CHOL/HDL Ratio: 4
Triglycerides: 312 mg/dL — ABNORMAL HIGH (ref 0.0–149.0)
VLDL: 62.4 mg/dL — ABNORMAL HIGH (ref 0.0–40.0)

## 2019-11-15 LAB — LDL CHOLESTEROL, DIRECT: Direct LDL: 100 mg/dL

## 2019-11-15 LAB — T4, FREE: Free T4: 0.86 ng/dL (ref 0.60–1.60)

## 2019-11-15 LAB — TSH: TSH: 1.78 u[IU]/mL (ref 0.35–4.50)

## 2019-11-15 NOTE — Progress Notes (Signed)
Name: Pamela Bennett  MRN/ DOB: [id number], [birth year]    Age/ Sex: 19 y.o., female     PCP: Jarold Motto, PA   Reason for Endocrinology Evaluation: Hypothyroidism     Initial Endocrinology Clinic Visit: 06/05/2020    PATIENT IDENTIFIER: Pamela Bennett is a 19 y.o., female with a past medical history of  Hashimoto's Thyroiditis . She has followed with [facility] since 06/06/2019 for consultative assistance with management of her Hypothyroidism and elevated free testosterone.  HISTORICAL SUMMARY:    Pt had been evaluated by Pediatric Endocrinology ( Dr. Fransico Michael ) for precocious puberty that was attributed to obesity requiring GnRh inhibitor implant. Pt became hypothyroid in 2007 requiring LT-4 initiation . Historically she has had issues with compliance.   She has been diagnosed with elevated testosterone  since her teenage years with no hirsutism . Menarche at 64. Menstruation has been regular over the years. No acne per se except at menarche  She was diagnosed with insulin resistance and has been  on Metformin .  Pt is adopted SUBJECTIVE:    Today (11/15/2019):  Pamela Bennett is here for a follow up   She has been noted to lose weight, has been going to the Gym. Has been working on cutting down sugar-sweetened beverages.   Denies nausea or diarrhea  Denies hirsutism , acne has improved  She is compliant with OCP's  She is compliant with LT-4 replacement.       ROS:  As per HPI.   HISTORY:  Past Medical History:  Past Medical History:  Diagnosis Date  . Hypothyroidism   . Ligament tear 02/2015   right small finger RCL tear   Past Surgical History:  Past Surgical History:  Procedure Laterality Date  . CLOSED REDUCTION RADIAL / ULNAR SHAFT FRACTURE Left 2004  . LIGAMENT REPAIR Right 03/15/2015   Procedure: RIGHT SMALL FINGER RADIAL COLLATERAL LIGAMENT RECONSTRUCTION ;  Surgeon: Dominica Severin, MD;  Location: [facility];  Service: Orthopedics;  Laterality: Right;  . PROXIMAL INTERPHALANGEAL FUSION (PIP) Right 03/15/2015   Procedure: RIGHT PROXIMAL INTERPHALANGEAL FUSION (PIP) JOINT WITH PALMARIS GRAFT;  Surgeon: Dominica Severin, MD;  Location: [facility];  Service: Orthopedics;  Laterality: Right;  . SUPPRELIN IMPLANT Left 10/14/2009   upper arm    Social History:  reports that she has never smoked. She has never used smokeless tobacco. She reports that she does not drink alcohol or use drugs. Family History:  Family History  Adopted: Yes     HOME MEDICATIONS: Allergies as of 11/15/2019      Reactions   Almond Oil Other (See Comments)   GI UPSET   Carrot [daucus Carota] Other (See Comments)   GI UPSET      Medication List       Accurate as of Nov 15, 2019  8:35 AM. If you have any questions, ask your nurse or doctor.        STOP taking these medications   predniSONE 20 MG tablet Commonly known as: DELTASONE Stopped by: Scarlette Shorts, MD     TAKE these medications   clindamycin-benzoyl peroxide gel Commonly known as: BenzaClin Apply topically 2 (two) times daily.   EPINEPHrine 0.3 mg/0.3 mL Soaj injection Commonly known as: EpiPen 2-Pak Inject 0.3 mLs (0.3 mg total) into the muscle as needed for anaphylaxis.   metFORMIN 500 MG tablet Commonly known as: GLUCOPHAGE TAKE 1 TABLET (500 MG TOTAL) BY MOUTH 2 TIMES DAILY  WITH A MEAL.   norgestimate-ethinyl estradiol 0.25-35 MG-MCG tablet Commonly known as: Sprintec 28 Take 1 tablet by mouth daily.   rosuvastatin 10 MG tablet Commonly known as: Crestor Take 1 tablet (10 mg total) by mouth daily.   Synthroid 25 MCG tablet Generic drug: levothyroxine TAKE 1 TABLET BY MOUTH DAILY         OBJECTIVE:   PHYSICAL EXAM: VS: BP (!) 142/88 (BP Location: Left Arm, Patient Position: Sitting, Cuff Size: Normal)   Pulse 63   Temp 98.7 F (37.1 C)   Ht 5\' 3"  (1.6 m)   Wt 172 lb 6.4 oz (78.2 kg)    LMP 10/14/2019 (Exact Date)   SpO2 99%   BMI 30.54 kg/m    EXAM: General: Pt appears well and is in NAD  Neck: General: Supple without adenopathy. Thyroid: Thyroid size normal.  No goiter or nodules appreciated. No thyroid bruit.  Lungs: Clear with good BS bilat with no rales, rhonchi, or wheezes  Heart: Auscultation: RRR.  Abdomen: Normoactive bowel sounds, soft, nontender, without masses or organomegaly palpable  Extremities:  BL LE: No pretibial edema normal ROM and strength.  Neuro: Cranial nerves: II - XII grossly intact  Motor: Normal strength throughout DTRs: 2+ and symmetric in UE without delay in relaxation phase  Mental Status: Judgment, insight: Intact Orientation: Oriented to time, place, and person Mood and affect: No depression, anxiety, or agitation     DATA REVIEWED:  Results for Pamela Bennett, Pamela Bennett (MRN [id number]) as of 11/17/2019 08:25  Ref. Range 11/15/2019 08:59  Sodium Latest Ref Range: 135 - 145 mEq/L 137  Potassium Latest Ref Range: 3.5 - 5.1 mEq/L 4.6  Chloride Latest Ref Range: 96 - 112 mEq/L 105  CO2 Latest Ref Range: 19 - 32 mEq/L 26  Glucose Latest Ref Range: 70 - 99 mg/dL 96  BUN Latest Ref Range: 6 - 23 mg/dL 11  Creatinine Latest Ref Range: 0.40 - 1.20 mg/dL 0.87  Calcium Latest Ref Range: 8.4 - 10.5 mg/dL 9.5  Alkaline Phosphatase Latest Ref Range: 39 - 117 U/L 51  Albumin Latest Ref Range: 3.5 - 5.2 g/dL 4.3  AST Latest Ref Range: 0 - 37 U/L 22  ALT Latest Ref Range: 0 - 35 U/L 20  Total Protein Latest Ref Range: 6.0 - 8.3 g/dL 7.3  Total Bilirubin Latest Ref Range: 0.2 - 1.2 mg/dL 0.3  GFR Latest Ref Range: >60.00 mL/min 82.01  Total CHOL/HDL Ratio Unknown 4  Cholesterol Latest Ref Range: 0 - 200 mg/dL 198  HDL Cholesterol Latest Ref Range: >39.00 mg/dL 46.90  Direct LDL Latest Units: mg/dL 100.0  NonHDL Unknown 150.96  Triglycerides Latest Ref Range: 0.0 - 149.0 mg/dL 312.0 (H)  VLDL Latest Ref Range: 0.0 - 40.0 mg/dL 62.4 (H)    Hemoglobin A1C Latest Ref Range: 4.6 - 6.5 % 5.6  TSH Latest Ref Range: 0.35 - 4.50 uIU/mL 1.78  T4,Free(Direct) Latest Ref Range: 0.60 - 1.60 ng/dL 0.86     ASSESSMENT / PLAN / RECOMMENDATIONS:  1. Hypothyroidism:   - She is clinically  - No local neck symptoms   Medications : Levothyroxine 25 mcg daily    2. Insulin Resistance:   - I have praised the pt on weight loss and encouraged her to continue with lifestyle changes - No side effects to Metformin, will continue    Medications :  Metformin 500 mg XR BID   3. 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  - She denies hirsutism, acne has improved - She is already on OCP's - No clinical indication to keep  checking testosterone unless she has new symptoms    4. Hypertriglyceridemia:   - There's probably a hereditary component with a Tg max of  519.0 mg/dL  - She is tolerating Crestor 10 mg daily - Tg is improving, will continue to monitor.      F/U in 1 yr  Signed electronically by: Lyndle Herrlich, MD  [facility]  [facility] [address] [place], [place] [address] Phone: [phone] FAX: [phone]      CC: Jarold Motto, [place] [address] [place] [address] Phone: [phone]  Fax: [phone]   Return to Endocrinology clinic as below: Future Appointments  Date Time Provider Department Center  12/26/2019  8:30 AM Jarold Motto, PA [facility]

## 2019-11-15 NOTE — Patient Instructions (Signed)
-   Keep up the good work  - Continue Metformin 500 mg, twice a day  - Continue Levothyroxine 25 mcg daily

## 2019-11-17 ENCOUNTER — Encounter: Payer: Self-pay | Admitting: Internal Medicine

## 2019-12-05 ENCOUNTER — Ambulatory Visit: Payer: 59 | Admitting: Internal Medicine

## 2019-12-26 ENCOUNTER — Ambulatory Visit: Payer: 59 | Admitting: Physician Assistant

## 2019-12-28 DIAGNOSIS — Z1152 Encounter for screening for COVID-19: Secondary | ICD-10-CM | POA: Diagnosis not present

## 2019-12-28 DIAGNOSIS — Z8616 Personal history of COVID-19: Secondary | ICD-10-CM | POA: Diagnosis not present

## 2019-12-28 DIAGNOSIS — E782 Mixed hyperlipidemia: Secondary | ICD-10-CM | POA: Diagnosis not present

## 2019-12-28 DIAGNOSIS — E039 Hypothyroidism, unspecified: Secondary | ICD-10-CM | POA: Diagnosis not present

## 2019-12-28 DIAGNOSIS — R7303 Prediabetes: Secondary | ICD-10-CM | POA: Diagnosis not present

## 2019-12-28 DIAGNOSIS — Z6829 Body mass index (BMI) 29.0-29.9, adult: Secondary | ICD-10-CM | POA: Diagnosis not present

## 2020-01-15 ENCOUNTER — Encounter: Payer: Self-pay | Admitting: Physician Assistant

## 2020-01-15 ENCOUNTER — Ambulatory Visit: Payer: 59 | Admitting: Physician Assistant

## 2020-01-15 ENCOUNTER — Other Ambulatory Visit: Payer: Self-pay

## 2020-01-15 VITALS — BP 110/70 | HR 57 | Temp 98.2°F | Ht 63.0 in | Wt 170.4 lb

## 2020-01-15 DIAGNOSIS — E782 Mixed hyperlipidemia: Secondary | ICD-10-CM

## 2020-01-15 DIAGNOSIS — Z719 Counseling, unspecified: Secondary | ICD-10-CM

## 2020-01-15 DIAGNOSIS — E063 Autoimmune thyroiditis: Secondary | ICD-10-CM | POA: Diagnosis not present

## 2020-01-15 MED ORDER — ROSUVASTATIN CALCIUM 10 MG PO TABS: 10.0000 mg | ORAL_TABLET | Freq: Every day | ORAL | 3 refills | Status: DC

## 2020-01-15 NOTE — Patient Instructions (Signed)
It was great to see you!  [email or web address]   Bonnee Quin the website to determine which A1c  Let's follow-up during winter break for your pap smear and labs.  Take care,  Jarold Motto PA-C

## 2020-01-15 NOTE — Progress Notes (Signed)
Pamela Bennett is a 19 y.o. female is here for a follow up.  I acted as a Neurosurgeon for [employer], PA-C Corky Mull, LPN   History of Present Illness:   Chief Complaint  Patient presents with  . Hypothyroidism  . Hyperlipidemia    HPI   Hypothyroidism Pt following up, currently taking Synthroid 25 mcg daily, does occasionally miss a dose. Denies heat/cold tolerance, hair loss, weight gain.  Lab Results  Component Value Date   TSH 1.78 11/15/2019     HLD Pt currently taking Crestor 10 MG. Tolerating this medication without significant health concerns.  Lipid Panel     Component Value Date/Time   CHOL 198 11/15/2019 0859   TRIG 312.0 (H) 11/15/2019 0859   HDL 46.90 11/15/2019 0859   CHOLHDL 4 11/15/2019 0859   VLDL 62.4 (H) 11/15/2019 0859   LDLCALC 56 12/14/2018 0800   LDLDIRECT 100.0 11/15/2019 0859   Vaccination Guidance Tested positive for Covid in February 2021.  She has not yet gotten the Covid vaccine.  She wants to discuss this today.    Health Maintenance Due  Topic Date Due  . Hepatitis C Screening  Never done  . COVID-19 Vaccine (1) Never done    Past Medical History:  Diagnosis Date  . Hypothyroidism   . Ligament tear 02/2015   right small finger RCL tear     Social History   Tobacco Use  . Smoking status: Never Smoker  . Smokeless tobacco: Never Used  Substance Use Topics  . Alcohol use: No  . Drug use: No    Past Surgical History:  Procedure Laterality Date  . CLOSED REDUCTION RADIAL / ULNAR SHAFT FRACTURE Left 2004  . LIGAMENT REPAIR Right 03/15/2015   Procedure: RIGHT SMALL FINGER RADIAL COLLATERAL LIGAMENT RECONSTRUCTION ;  Surgeon: Dominica Severin, MD;  Location: [facility];  Service: Orthopedics;  Laterality: Right;  . PROXIMAL INTERPHALANGEAL FUSION (PIP) Right 03/15/2015   Procedure: RIGHT PROXIMAL INTERPHALANGEAL FUSION (PIP) JOINT WITH PALMARIS GRAFT;  Surgeon: Dominica Severin, MD;  Location:  [facility];  Service: Orthopedics;  Laterality: Right;  . SUPPRELIN IMPLANT Left 10/14/2009   upper arm    Family History  Adopted: Yes    PMHx, SurgHx, SocialHx, FamHx, Medications, and Allergies were reviewed in the Visit Navigator and updated as appropriate.   Patient Active Problem List   Diagnosis Date Noted  . 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 Regular astigmatism of both eyes 06/08/2017  . 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 Thyroiditis, autoimmune 11/21/2012  . Osgood-Schlatter's disease 02/10/2012  . Goiter   . Hypothyroidism, acquired, autoimmune   . Short stature   . Dyspepsia   . Obesity   . Precocious sexual development and puberty 10/15/2010    Social History   Tobacco Use  . Smoking status: Never Smoker  . Smokeless tobacco: Never Used  Substance Use Topics  . Alcohol use: No  . Drug use: No    Current Medications and Allergies:    Current Outpatient Medications:  .  clindamycin-benzoyl peroxide (BENZACLIN) gel, Apply topically 2 (two) times daily., Disp: 25 g, Rfl: 0 .  EPINEPHrine (EPIPEN 2-PAK) 0.3 mg/0.3 mL IJ SOAJ injection, Inject 0.3 mLs (0.3 mg total) into the muscle as needed for anaphylaxis., Disp: 2 each, Rfl: 0 .  metFORMIN (GLUCOPHAGE) 500 MG tablet, TAKE 1 TABLET (500 MG TOTAL) BY MOUTH 2 TIMES DAILY WITH A MEAL., Disp: 60 tablet, Rfl: 6 .  norgestimate-ethinyl estradiol (  SPRINTEC 28) 0.25-35 MG-MCG tablet, Take 1 tablet by mouth daily., Disp: 1 Package, Rfl: 11 .  rosuvastatin (CRESTOR) 10 MG tablet, Take 1 tablet (10 mg total) by mouth daily., Disp: 90 tablet, Rfl: 3 .  SYNTHROID 25 MCG tablet, TAKE 1 TABLET BY MOUTH DAILY, Disp: 90 tablet, Rfl: 3   Allergies  Allergen Reactions  . Almond Oil Other (See Comments)    GI UPSET  . Carrot [Daucus Carota] Other (See Comments)    GI UPSET    Review of Systems   ROS  Negative unless otherwise specified per HPI.  Vitals:   Vitals:   01/15/20 1106  BP:  110/70  Pulse: 57  Temp: 98.2 F (36.8 C)  TempSrc: Temporal  SpO2: 99%  Weight: 170 lb 6.1 oz (77.3 kg)  Height: 5\' 3"  (1.6 m)     Body mass index is 30.18 kg/m.   Physical Exam:    Physical Exam Constitutional:      Appearance: She is well-developed.  HENT:     Head: Normocephalic and atraumatic.  Eyes:     Conjunctiva/sclera: Conjunctivae normal.  Pulmonary:     Effort: Pulmonary effort is normal.  Musculoskeletal:        General: Normal range of motion.     Cervical back: Normal range of motion and neck supple.  Skin:    General: Skin is warm and dry.  Neurological:     Mental Status: She is alert and oriented to person, place, and time.  Psychiatric:        Behavior: Behavior normal.        Thought Content: Thought content normal.        Judgment: Judgment normal.      Assessment and Plan:    Pamela Bennett was seen today for hypothyroidism and hyperlipidemia.  Diagnoses and all orders for this visit:  Hypothyroidism, acquired, autoimmune Currently well controlled.  We will recheck labs in the winter.  Continue current prescription.  Mixed hyperlipidemia Currently well controlled.  We will recheck labs in the winter.  Continue current prescription. -     rosuvastatin (CRESTOR) 10 MG tablet; Take 1 tablet (10 mg total) by mouth daily.  Health education/counseling We discussed the COVID-19 vaccine and possible risks of the vaccine.  All questions regarding vaccine were answered.   . Reviewed expectations re: course of current medical issues. . Discussed self-management of symptoms. . Outlined signs and symptoms indicating need for more acute intervention. . Patient verbalized understanding and all questions were answered. . See orders for this visit as documented in the electronic medical record. . Patient received an After Visit Summary.  CMA or LPN served as scribe during this visit. History, Physical, and Plan performed by medical provider. The above  documentation has been reviewed and is accurate and complete.   [birth year]07-1987, PA-C [place], [place] 01/15/2020  Follow-up: No follow-ups on file.

## 2020-03-07 DIAGNOSIS — M2241 Chondromalacia patellae, right knee: Secondary | ICD-10-CM | POA: Diagnosis not present

## 2020-03-07 DIAGNOSIS — M25561 Pain in right knee: Secondary | ICD-10-CM | POA: Diagnosis not present

## 2020-04-14 IMAGING — US US ABDOMEN LIMITED
1 series · 14 of 25 positions shown · non-contrast
Comparison: None.

CLINICAL DATA: Elevated LFTs

EXAM:
ULTRASOUND ABDOMEN LIMITED RIGHT UPPER QUADRANT

[Series 1: us abdomen limited · 0.25mm/px · 14 of 33 slices shown]
[im 1/33]
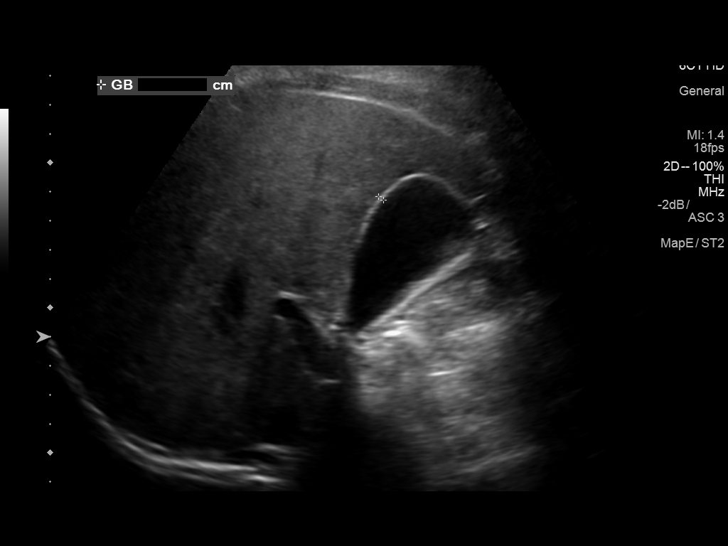
[im 3/33]
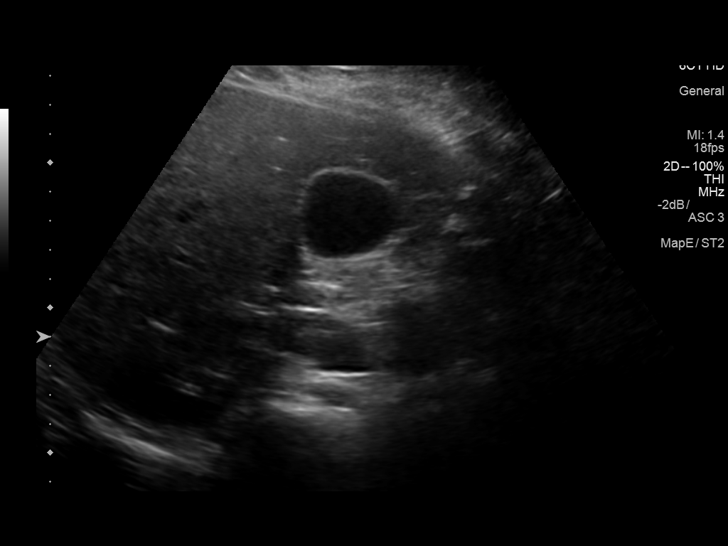
[im 6/33]
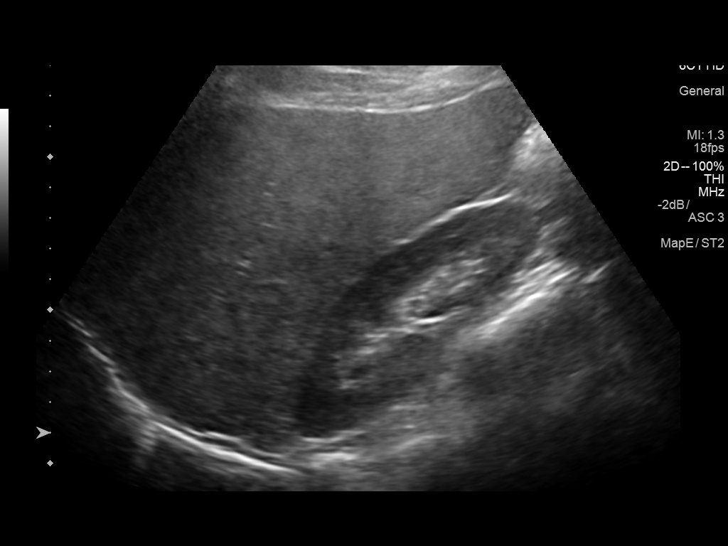
[im 9/33]
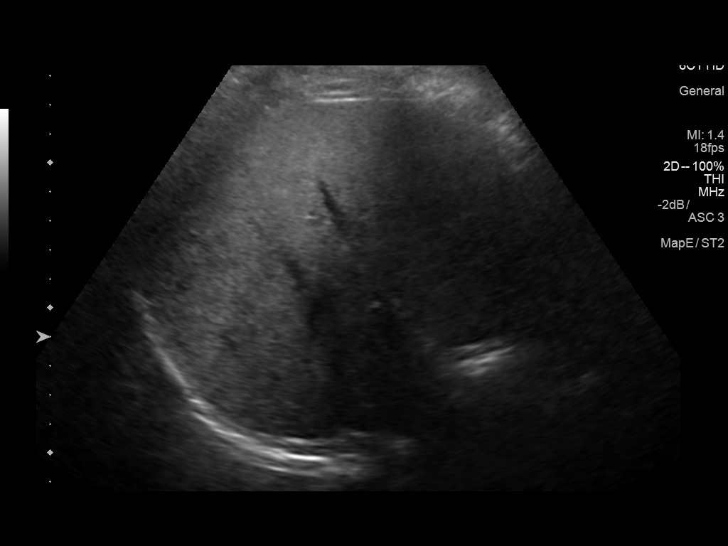
[im 11/33]
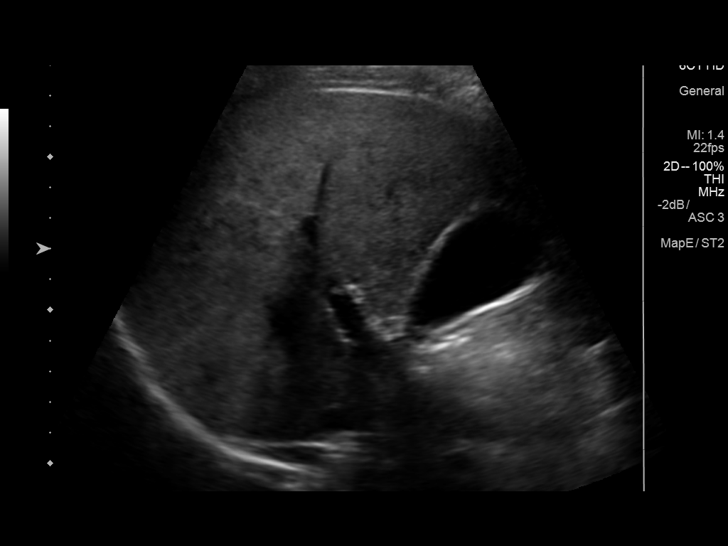
[im 13/33]
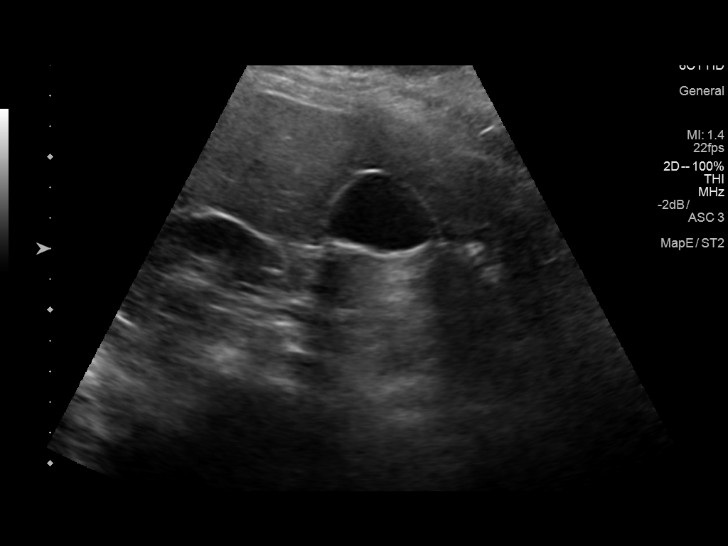
[im 15/33]
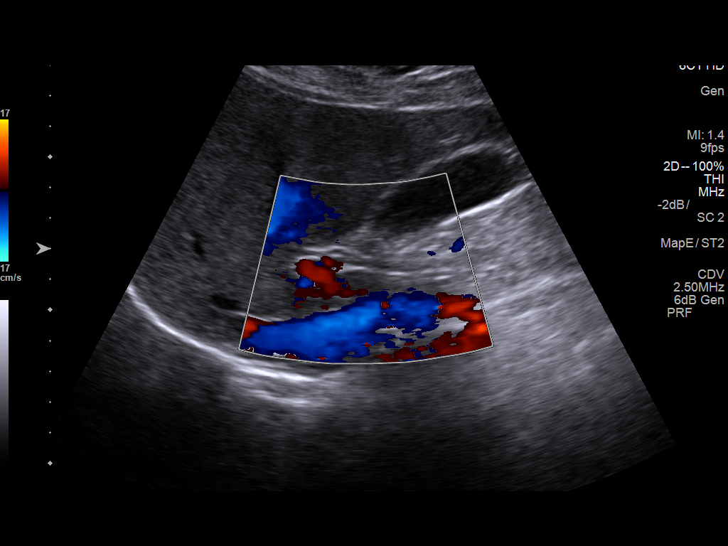
[im 18/33]
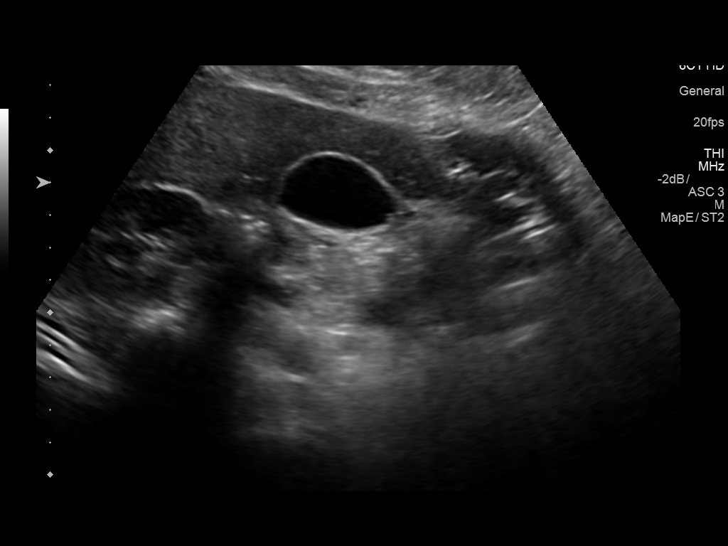
[im 21/33]
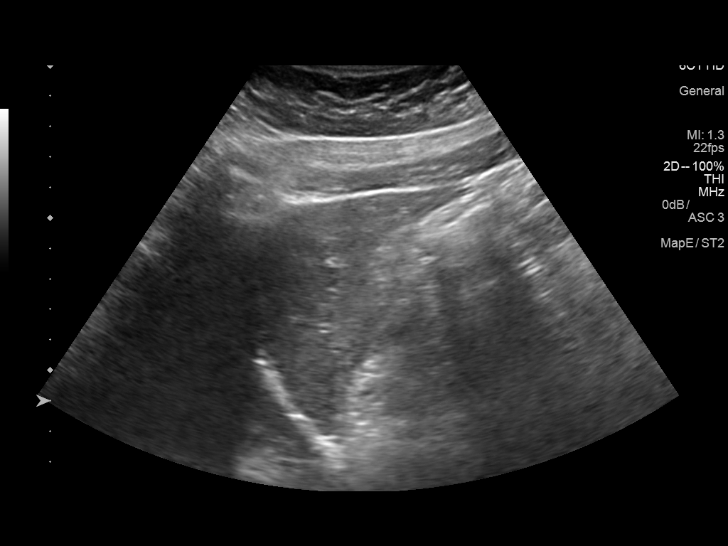
[im 22/33]
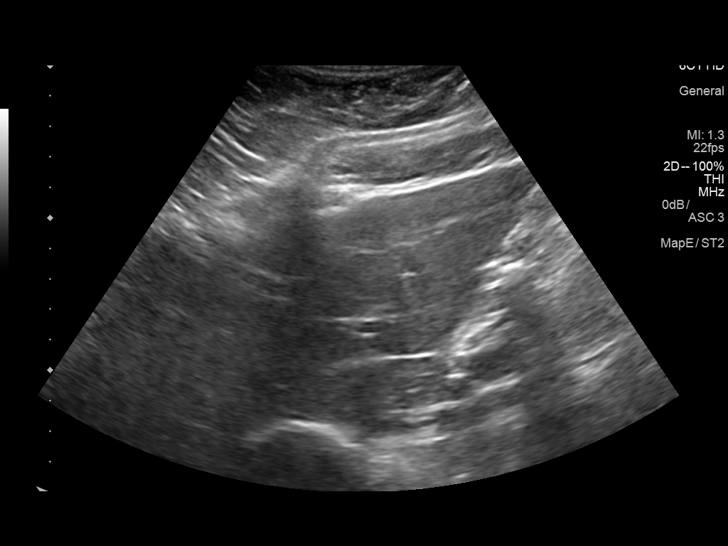
[im 25/33]
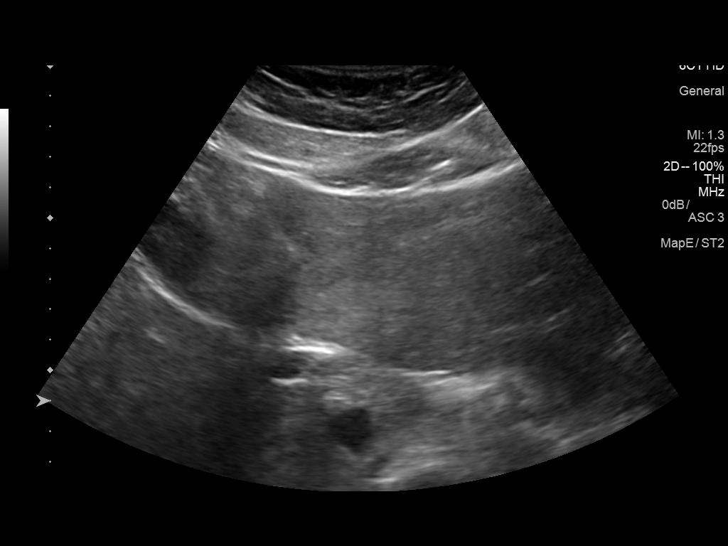
[im 27/33]
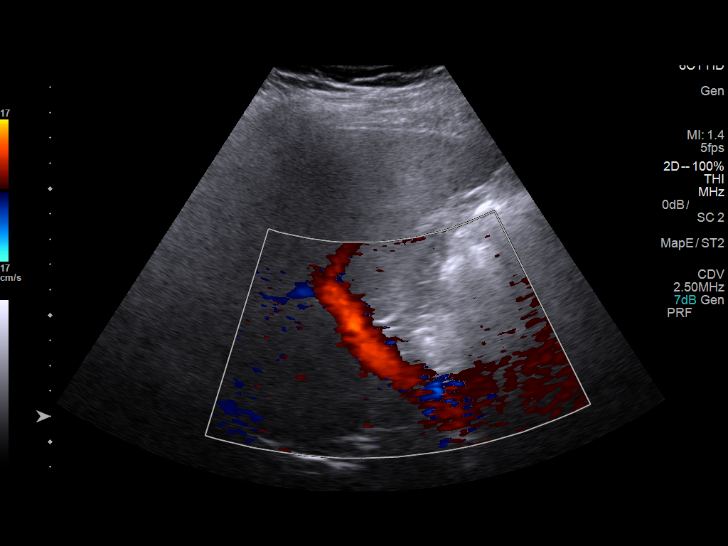
[im 30/33]
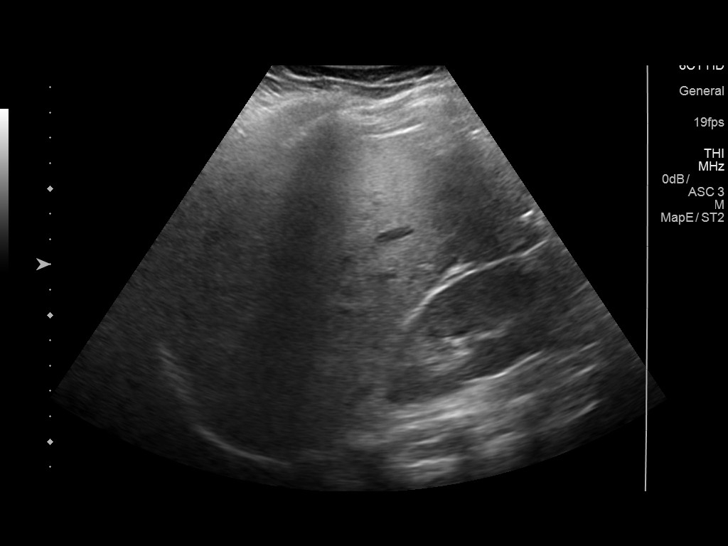
[im 33/33]
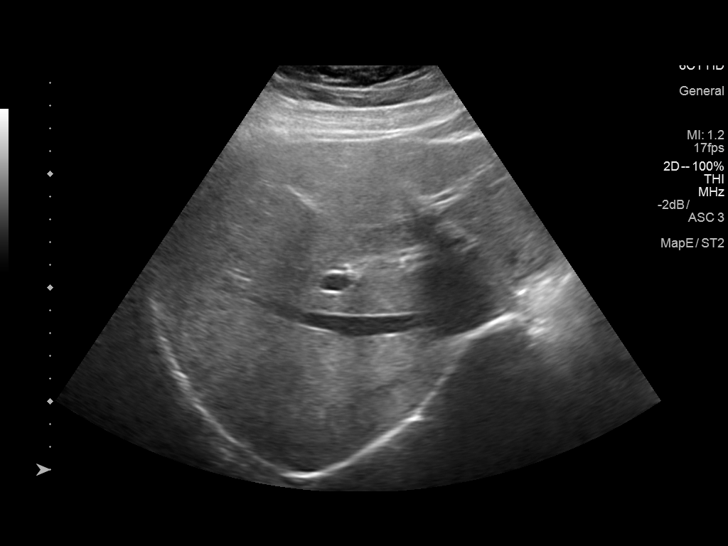

[14 of 25 positions shown; findings below may reference images not displayed]

FINDINGS: Gallbladder:

No gallstones or wall thickening visualized. No sonographic Murphy
sign noted by sonographer.

Common bile duct:

Diameter: 2.9 mm

Liver:

Mild increased hepatic echogenicity compatible with hepatic
steatosis or fatty infiltration. No focal hepatic abnormality or
intrahepatic biliary dilatation. No surrounding free fluid or
ascites.

Portal vein is patent on color Doppler imaging with normal direction
of blood flow towards the liver.
IMPRESSION: Mild hepatic steatosis.  Negative for gallstones or acute finding.

## 2020-04-21 DIAGNOSIS — Z6830 Body mass index (BMI) 30.0-30.9, adult: Secondary | ICD-10-CM | POA: Diagnosis not present

## 2020-04-21 DIAGNOSIS — E669 Obesity, unspecified: Secondary | ICD-10-CM | POA: Diagnosis not present

## 2020-04-21 DIAGNOSIS — Z7251 High risk heterosexual behavior: Secondary | ICD-10-CM | POA: Diagnosis not present

## 2020-04-21 DIAGNOSIS — Z202 Contact with and (suspected) exposure to infections with a predominantly sexual mode of transmission: Secondary | ICD-10-CM | POA: Diagnosis not present

## 2020-04-21 DIAGNOSIS — Z113 Encounter for screening for infections with a predominantly sexual mode of transmission: Secondary | ICD-10-CM | POA: Diagnosis not present

## 2020-04-22 DIAGNOSIS — Z6830 Body mass index (BMI) 30.0-30.9, adult: Secondary | ICD-10-CM | POA: Diagnosis not present

## 2020-04-22 DIAGNOSIS — Z202 Contact with and (suspected) exposure to infections with a predominantly sexual mode of transmission: Secondary | ICD-10-CM | POA: Diagnosis not present

## 2020-04-22 DIAGNOSIS — E669 Obesity, unspecified: Secondary | ICD-10-CM | POA: Diagnosis not present

## 2020-04-22 DIAGNOSIS — Z113 Encounter for screening for infections with a predominantly sexual mode of transmission: Secondary | ICD-10-CM | POA: Diagnosis not present

## 2020-04-22 DIAGNOSIS — Z7251 High risk heterosexual behavior: Secondary | ICD-10-CM | POA: Diagnosis not present

## 2020-07-04 ENCOUNTER — Encounter: Payer: Self-pay | Admitting: Physician Assistant

## 2020-07-04 MED ORDER — NORGESTIMATE-ETH ESTRADIOL 0.25-35 MG-MCG PO TABS: 1.0000 | ORAL_TABLET | Freq: Every day | ORAL | 3 refills | Status: DC

## 2020-11-19 ENCOUNTER — Ambulatory Visit: Payer: 59 | Admitting: Internal Medicine

## 2021-03-19 ENCOUNTER — Ambulatory Visit: Payer: 59 | Admitting: Physician Assistant

## 2021-03-25 ENCOUNTER — Encounter: Payer: Self-pay | Admitting: Physician Assistant

## 2021-03-25 ENCOUNTER — Other Ambulatory Visit: Payer: Self-pay

## 2021-03-25 ENCOUNTER — Ambulatory Visit (INDEPENDENT_AMBULATORY_CARE_PROVIDER_SITE_OTHER): Payer: 59 | Admitting: Physician Assistant

## 2021-03-25 ENCOUNTER — Other Ambulatory Visit (HOSPITAL_COMMUNITY)
Admission: RE | Admit: 2021-03-25 | Discharge: 2021-03-25 | Disposition: A | Discharge status: Home / Self Care | Payer: 59 | Source: Ambulatory Visit | Attending: Physician Assistant | Admitting: Physician Assistant

## 2021-03-25 VITALS — BP 130/86 | HR 74 | Temp 98.4°F | Ht 63.0 in | Wt 181.5 lb

## 2021-03-25 DIAGNOSIS — Z124 Encounter for screening for malignant neoplasm of cervix: Secondary | ICD-10-CM

## 2021-03-25 DIAGNOSIS — E88819 Insulin resistance, unspecified: Secondary | ICD-10-CM

## 2021-03-25 DIAGNOSIS — E063 Autoimmune thyroiditis: Secondary | ICD-10-CM

## 2021-03-25 DIAGNOSIS — Z0001 Encounter for general adult medical examination with abnormal findings: Secondary | ICD-10-CM | POA: Diagnosis not present

## 2021-03-25 DIAGNOSIS — E669 Obesity, unspecified: Secondary | ICD-10-CM

## 2021-03-25 DIAGNOSIS — Z8619 Personal history of other infectious and parasitic diseases: Secondary | ICD-10-CM

## 2021-03-25 DIAGNOSIS — Z23 Encounter for immunization: Secondary | ICD-10-CM | POA: Diagnosis not present

## 2021-03-25 DIAGNOSIS — E8881 Metabolic syndrome: Secondary | ICD-10-CM | POA: Diagnosis not present

## 2021-03-25 DIAGNOSIS — E782 Mixed hyperlipidemia: Secondary | ICD-10-CM

## 2021-03-25 LAB — CBC WITH DIFFERENTIAL/PLATELET
Basophils Absolute: 0 10*3/uL (ref 0.0–0.1)
Basophils Relative: 0.6 % (ref 0.0–3.0)
Eosinophils Absolute: 0.1 10*3/uL (ref 0.0–0.7)
Eosinophils Relative: 1.1 % (ref 0.0–5.0)
HCT: 41.2 % (ref 36.0–46.0)
Hemoglobin: 13.8 g/dL (ref 12.0–15.0)
Lymphocytes Relative: 25.2 % (ref 12.0–46.0)
Lymphs Abs: 1.6 10*3/uL (ref 0.7–4.0)
MCHC: 33.5 g/dL (ref 30.0–36.0)
MCV: 87.8 fl (ref 78.0–100.0)
Monocytes Absolute: 0.4 10*3/uL (ref 0.1–1.0)
Monocytes Relative: 5.9 % (ref 3.0–12.0)
Neutro Abs: 4.2 10*3/uL (ref 1.4–7.7)
Neutrophils Relative %: 67.2 % (ref 43.0–77.0)
Platelets: 250 10*3/uL (ref 150.0–400.0)
RBC: 4.69 Mil/uL (ref 3.87–5.11)
RDW: 13.2 % (ref 11.5–15.5)
WBC: 6.2 10*3/uL (ref 4.0–10.5)

## 2021-03-25 LAB — TSH: TSH: 2.3 u[IU]/mL (ref 0.35–5.50)

## 2021-03-25 LAB — COMPREHENSIVE METABOLIC PANEL
ALT: 15 U/L (ref 0–35)
AST: 21 U/L (ref 0–37)
Albumin: 4.4 g/dL (ref 3.5–5.2)
Alkaline Phosphatase: 54 U/L (ref 39–117)
BUN: 15 mg/dL (ref 6–23)
CO2: 25 mEq/L (ref 19–32)
Calcium: 9.3 mg/dL (ref 8.4–10.5)
Chloride: 101 mEq/L (ref 96–112)
Creatinine, Ser: 0.97 mg/dL (ref 0.40–1.20)
GFR: 82.9 mL/min (ref >60.00)
Glucose, Bld: 92 mg/dL (ref 70–99)
Potassium: 4 mEq/L (ref 3.5–5.1)
Sodium: 135 mEq/L (ref 135–145)
Total Bilirubin: 0.4 mg/dL (ref 0.2–1.2)
Total Protein: 7.9 g/dL (ref 6.0–8.3)

## 2021-03-25 LAB — HEMOGLOBIN A1C: Hgb A1c MFr Bld: 5.5 % (ref 4.6–6.5)

## 2021-03-25 LAB — LIPID PANEL
Cholesterol: 254 mg/dL — ABNORMAL HIGH (ref 0–200)
HDL: 63.1 mg/dL (ref >39.00)
NonHDL: 190.72
Total CHOL/HDL Ratio: 4
Triglycerides: 293 mg/dL — ABNORMAL HIGH (ref 0.0–149.0)
VLDL: 58.6 mg/dL — ABNORMAL HIGH (ref 0.0–40.0)

## 2021-03-25 LAB — LDL CHOLESTEROL, DIRECT: Direct LDL: 148 mg/dL

## 2021-03-25 NOTE — Patient Instructions (Signed)
It was great to see you! ? ?Please go to the lab for blood work.  ? ?Our office will call you with your results unless you have chosen to receive results via MyChart. ? ?If your blood work is normal we will follow-up each year for physicals and as scheduled for chronic medical problems. ? ?If anything is abnormal we will treat accordingly and get you in for a follow-up. ? ?Take care, ? ?Brexlee Heberlein ?  ? ? ?

## 2021-03-25 NOTE — Progress Notes (Signed)
Subjective:    Pamela Bennett is a 20 y.o. female and is here for a comprehensive physical exam.  HPI  Health Maintenance Due  Topic Date Due   HPV VACCINES (1 - 2-dose series) Never done   Hepatitis C Screening  Never done   PAP-Cervical Cytology Screening  Never done   PAP SMEAR-Modifier  Never done   TETANUS/TDAP  02/04/2020   INFLUENZA VACCINE  01/20/2021   COVID-19 Vaccine (3 - Booster for Pfizer series) 03/09/2021    Acute Concerns: Hx of chlamydia -- was exposed to and diagnosed with chlamydia in Oct 2021. She took treatment as directed. Denies any current symptoms and reports that she was asymptomatic then.  Chronic Issues: Hypothyroid Pamela Bennett has been taking synthroid 25 mcg occasionally since her previous TSH, 11/15/19, showed no signs for concern. She was told by her endocrinologist, Dr. Lonzo Cloud, that she did not need to take this regularly.  High Cholesterol Currently taking crestor 10 mg with no adverse effects . She is managing well.   Insulin resistance Pamela Bennett has been taking metformin 500 mg more times than not, but not on a consistent basis. She is managing well.   Health Maintenance: Immunizations -- COVID- Never done Influenza Vaccine- Last completed 04/09/18.  Tdap- Last completed 03/25/21.  PAP -- Never done. Due at this time.  Bone Density -- N/A Diet -- Not as healthy of a diet that she would like Sleep habits -- Sleeping well.  Ophthalmology- Recently saw one to make sure prescription was the same before switching providers.  Dentistry- Has not seen one regularly. Exercise -- Strength training 4-5x a week  Current Weight -- Light gain due to moving around a lot and lack of food planning. Weight History: Wt Readings from Last 10 Encounters:  01/15/20 170 lb 6.1 oz (77.3 kg)  11/15/19 172 lb 6.4 oz (78.2 kg)  06/29/19 179 lb 4 oz (81.3 kg)  06/05/19 182 lb 6.4 oz (82.7 kg)  12/26/18 179 lb (81.2 kg)  06/27/18 179 lb 9.6 oz (81.5  kg) (94 %, Z= 1.59)*  06/13/18 177 lb 12.8 oz (80.6 kg) (94 %, Z= 1.55)*  12/13/17 170 lb (77.1 kg) (92 %, Z= 1.41)*  09/26/17 150 lb (68 kg) (82 %, Z= 0.90)*  06/07/17 168 lb 6.4 oz (76.4 kg) (92 %, Z= 1.41)*   * Growth percentiles are based on CDC (Girls, 2-20 Years) data.   There is no height or weight on file to calculate BMI. Mood -- Stable Alcohol- Occasional use, one can of beer x week  Patient's last menstrual period was 03/10/2021 (exact date). Period characteristics -- No unusual symptoms.  Birth control -- Currently compliant with Sprintec 28 0.25-0.35 mg with no adverse effects.      reports no history of alcohol use.  Tobacco Use: Low Risk    Smoking Tobacco Use: Never   Smokeless Tobacco Use: Never     Depression screen PHQ 2/9 03/25/2021  Decreased Interest 0  Down, Depressed, Hopeless 0  PHQ - 2 Score 0  Altered sleeping -  Tired, decreased energy -  Change in appetite -  Feeling bad or failure about yourself  -  Trouble concentrating -  Moving slowly or fidgety/restless -  Suicidal thoughts -  PHQ-9 Score -  Difficult doing work/chores -     Other providers/specialists: Patient Care Team: Jarold Motto, [place] as PCP - General (Physician Assistant)   PMHx, SurgHx, SocialHx, Medications, and Allergies were reviewed in the Visit Navigator  and updated as appropriate.   Past Medical History:  Diagnosis Date   Hypothyroidism    Ligament tear 02/2015   right small finger RCL tear     Past Surgical History:  Procedure Laterality Date   CLOSED REDUCTION RADIAL / ULNAR SHAFT FRACTURE Left 2004   LIGAMENT REPAIR Right 03/15/2015   Procedure: RIGHT SMALL FINGER RADIAL COLLATERAL LIGAMENT RECONSTRUCTION ;  Surgeon: Dominica Severin, MD;  Location: [facility];  Service: Orthopedics;  Laterality: Right;   PROXIMAL INTERPHALANGEAL FUSION (PIP) Right 03/15/2015   Procedure: RIGHT PROXIMAL INTERPHALANGEAL FUSION (PIP) JOINT WITH PALMARIS GRAFT;   Surgeon: Dominica Severin, MD;  Location: [facility];  Service: Orthopedics;  Laterality: Right;   SUPPRELIN IMPLANT Left 10/14/2009   upper arm     Family History  Adopted: Yes    Social History   Tobacco Use   Smoking status: Never   Smokeless tobacco: Never  Substance Use Topics   Alcohol use: No   Drug use: No    Review of Systems:   Review of Systems  Constitutional:  Negative for chills, fever, malaise/fatigue and weight loss.  HENT:  Negative for hearing loss, sinus pain and sore throat.   Respiratory:  Negative for cough and hemoptysis.   Cardiovascular:  Negative for chest pain, palpitations, leg swelling and PND.  Gastrointestinal:  Negative for abdominal pain, constipation, diarrhea, heartburn, nausea and vomiting.  Genitourinary:  Negative for dysuria, frequency and urgency.  Musculoskeletal:  Negative for back pain, myalgias and neck pain.  Skin:  Negative for itching and rash.  Neurological:  Negative for dizziness, tingling, seizures and headaches.  Endo/Heme/Allergies:  Negative for polydipsia.  Psychiatric/Behavioral:  Negative for depression. The patient is not nervous/anxious.     Objective:   LMP 03/10/2021 (Exact Date)   General Appearance:    Alert, cooperative, no distress, appears stated age  Head:    Normocephalic, without obvious abnormality, atraumatic  Eyes:    PERRL, conjunctiva/corneas clear, EOM's intact, fundi    benign, both eyes  Ears:    Normal TM's and external ear canals, both ears  Nose:   Nares normal, septum midline, mucosa normal, no drainage    or sinus tenderness  Throat:   Lips, mucosa, and tongue normal; teeth and gums normal  Neck:   Supple, symmetrical, trachea midline, no adenopathy;    thyroid:  no enlargement/tenderness/nodules; no carotid   bruit or JVD  Back:     Symmetric, no curvature, ROM normal, no CVA tenderness  Lungs:     Clear to auscultation bilaterally, respirations unlabored  Chest Wall:     No tenderness or deformity   Heart:    Regular rate and rhythm, S1 and S2 normal, no murmur, rub   or gallop  Breast Exam:    No tenderness, masses, or nipple abnormality  Abdomen:     Soft, non-tender, bowel sounds active all four quadrants,    no masses, no organomegaly  Genitalia:    Normal female without lesion, discharge or tenderness  Rectal:    Normal tone no masses or tenderness  Extremities:   Extremities normal, atraumatic, no cyanosis or edema  Pulses:   2+ and symmetric all extremities  Skin:   Skin color, texture, turgor normal, no rashes or lesions  Lymph nodes:   Cervical, supraclavicular, and axillary nodes normal  Neurologic:   CNII-XII intact, normal strength, sensation and reflexes    throughout    Assessment/Plan:   Encounter for general  adult medical examination with abnormal findings Today patient counseled on age appropriate routine health concerns for screening and prevention, each reviewed and up to date or declined. Immunizations reviewed and up to date or declined. Labs ordered and reviewed. Risk factors for depression reviewed and negative. Hearing function and visual acuity are intact. ADLs screened and addressed as needed. Functional ability and level of safety reviewed and appropriate. Education, counseling and referrals performed based on assessed risks today. Patient provided with a copy of personalized plan for preventive services.  Mixed hyperlipidemia Update lipid panel today and adjust crestor 10 mg daily as indicated  Hypothyroidism, acquired, autoimmune Update TSH today and adjust levothyroxine 25 mcg as indicated  Obesity, unspecified classification, unspecified obesity type, unspecified whether serious comorbidity present Encouraged ongoing efforts at healthy diet and regular exercise  Pap smear for cervical cancer screening Performed today  History of chlamydia infection Will perform STD check today and treat if any abnormalities  Insulin  resistance Update HgbA1c and adjust 500 mg metformin as indicated   Patient Counseling:   [x]     Nutrition: Stressed importance of moderation in sodium/caffeine intake, saturated fat and cholesterol, caloric balance, sufficient intake of fresh fruits, vegetables, fiber, calcium, iron, and 1 mg of folate supplement per day (for females capable of pregnancy).   [x]      Stressed the importance of regular exercise.    [x]     Substance Abuse: Discussed cessation/primary prevention of tobacco, alcohol, or other drug use; driving or other dangerous activities under the influence; availability of treatment for abuse.    [x]      Injury prevention: Discussed safety belts, safety helmets, smoke detector, smoking near bedding or upholstery.    [x]      Sexuality: Discussed sexually transmitted diseases, partner selection, use of condoms, avoidance of unintended pregnancy  and contraceptive alternatives.    [x]     Dental health: Discussed importance of regular tooth brushing, flossing, and dental visits.   [x]      Health maintenance and immunizations reviewed. Please refer to Health maintenance section.   I,Havlyn C Ratchford,acting as a for , PA.,have documented all relevant documentation on the behalf of , PA,as directed by  , PA while in the presence of , .   I, , Neurosurgeon, have reviewed all documentation for this visit. The documentation on 03/25/21 for the exam, diagnosis, procedures, and orders are all accurate and complete.   Jarold Motto, PA-C [facility]

## 2021-03-25 NOTE — Addendum Note (Signed)
Addended by: Jimmye Norman on: 03/25/2021 12:04 PM   Modules accepted: Orders

## 2021-03-26 LAB — CERVICOVAGINAL ANCILLARY ONLY
Bacterial Vaginitis (gardnerella): POSITIVE — AB
Candida Glabrata: NEGATIVE
Candida Vaginitis: NEGATIVE
Chlamydia: NEGATIVE
Comment: NEGATIVE
Comment: NEGATIVE
Comment: NEGATIVE
Comment: NEGATIVE
Comment: NEGATIVE
Comment: NORMAL
Neisseria Gonorrhea: NEGATIVE
Trichomonas: NEGATIVE

## 2021-03-27 ENCOUNTER — Other Ambulatory Visit (HOSPITAL_BASED_OUTPATIENT_CLINIC_OR_DEPARTMENT_OTHER): Payer: Self-pay

## 2021-03-27 ENCOUNTER — Other Ambulatory Visit: Payer: Self-pay | Admitting: Physician Assistant

## 2021-03-27 ENCOUNTER — Encounter: Payer: Self-pay | Admitting: Physician Assistant

## 2021-03-27 LAB — CYTOLOGY - PAP

## 2021-03-27 MED ORDER — ROSUVASTATIN CALCIUM 20 MG PO TABS
20.0000 mg | ORAL_TABLET | Freq: Every day | ORAL | 3 refills | Status: DC
  Filled 2021-03-27: qty 90, 90d supply, fill #0
  Filled 2021-11-17: qty 90, 90d supply, fill #1

## 2021-03-27 MED ORDER — METRONIDAZOLE 500 MG PO TABS
500.0000 mg | ORAL_TABLET | Freq: Two times a day (BID) | ORAL | 0 refills | Status: AC
  Filled 2021-03-27: qty 14, 7d supply, fill #0

## 2021-03-27 MED ORDER — METFORMIN HCL 500 MG PO TABS
500.0000 mg | ORAL_TABLET | Freq: Every day | ORAL | 3 refills | Status: DC
  Filled 2021-03-27: qty 90, 90d supply, fill #0
  Filled 2021-11-17: qty 90, 90d supply, fill #1

## 2021-04-21 ENCOUNTER — Other Ambulatory Visit (HOSPITAL_BASED_OUTPATIENT_CLINIC_OR_DEPARTMENT_OTHER): Payer: Self-pay

## 2021-04-22 ENCOUNTER — Telehealth: Payer: Self-pay

## 2021-04-22 NOTE — Telephone Encounter (Signed)
Spoke to pt told her yes she is due for HPV vaccines if she has never had them. Please check with your insurance to make sure covered. If covered can give first vaccine at your visit on 11/7 with [facility]. Pt verbalized understanding and will check insurance.

## 2021-04-22 NOTE — Telephone Encounter (Signed)
Patient is wondering if she is due for her HPV vaccine.

## 2021-04-28 ENCOUNTER — Encounter: Payer: Self-pay | Admitting: *Deleted

## 2021-04-28 ENCOUNTER — Ambulatory Visit: Payer: 59 | Admitting: Physician Assistant

## 2021-04-28 ENCOUNTER — Ambulatory Visit (INDEPENDENT_AMBULATORY_CARE_PROVIDER_SITE_OTHER): Payer: 59 | Admitting: *Deleted

## 2021-04-28 DIAGNOSIS — Z23 Encounter for immunization: Secondary | ICD-10-CM | POA: Diagnosis not present

## 2021-04-28 NOTE — Progress Notes (Signed)
Per orders of Jarold Motto, [place], injection of HPV vaccine 0.5 ml given IM by Corky Mull, LPN in left deltoid. Patient tolerated injection well. Patient will make appointment for 1 month for next injection.

## 2021-05-25 ENCOUNTER — Encounter: Payer: Self-pay | Admitting: Physician Assistant

## 2021-05-27 NOTE — Progress Notes (Signed)
Pamela Bennett is a 20 y.o. female here for vaginal itching.   History of Present Illness:   Chief Complaint  Patient presents with   Need 2nd does of HPV   Vaginal Itching    Was itching in vaginal area 5 days ago, has stopped  Took OTC cream and inserts    Medication Refill    HPI   Vaginal Itching Pamela Bennett expresses she had been feeling vaginal itching for five days following an unprotected sexual encounter. At the time she said the only sx she was having was itching and tenderness that she believed was caused by lack of lubrication. In an effort to treat sx, she used a 3 day monistat suppository kit which provided her with relief. Currently denies vaginal discharge, itching,  vaginal bleeding, or concerns for pregnancy. Although she is not experiencing any sx, she would like to get tested for the STDs.     Contraception Currently Pamela Bennett is taking Sprintec 28 0.25-35 mg daily with no adverse effects. She is tolerating well and needs a refill today.    Past Medical History:  Diagnosis Date   Allergy    Hypothyroidism    Ligament tear 02/21/2015   right small finger RCL tear     Social History   Tobacco Use   Smoking status: Never   Smokeless tobacco: Never  Substance Use Topics   Alcohol use: Yes    Alcohol/week: 1.0 standard drink    Types: 1 Cans of beer per week   Drug use: No    Past Surgical History:  Procedure Laterality Date   CLOSED REDUCTION RADIAL / ULNAR SHAFT FRACTURE Left 06/22/2002   FRACTURE SURGERY  2004   LIGAMENT REPAIR Right 03/15/2015   Procedure: RIGHT SMALL FINGER RADIAL COLLATERAL LIGAMENT RECONSTRUCTION ;  Surgeon: Roseanne Kaufman, MD;  Location: [place];  Service: Orthopedics;  Laterality: Right;   PROXIMAL INTERPHALANGEAL FUSION (PIP) Right 03/15/2015   Procedure: RIGHT PROXIMAL INTERPHALANGEAL FUSION (PIP) JOINT WITH PALMARIS GRAFT;  Surgeon: Roseanne Kaufman, MD;  Location: [facility];  Service:  Orthopedics;  Laterality: Right;   SUPPRELIN IMPLANT Left 10/14/2009   upper arm    Family History  Adopted: Yes    Allergies  Allergen Reactions   Almond Oil Other (See Comments)    GI UPSET   Carrot [Daucus Carota] Other (See Comments)    GI UPSET    Current Medications:   Current Outpatient Medications:    EPINEPHrine (EPIPEN 2-PAK) 0.3 mg/0.3 mL IJ SOAJ injection, Inject 0.3 mLs (0.3 mg total) into the muscle as needed for anaphylaxis., Disp: 2 each, Rfl: 0   metFORMIN (GLUCOPHAGE) 500 MG tablet, Take 1 tablet (500 mg total) by mouth daily with breakfast., Disp: 90 tablet, Rfl: 3   norgestimate-ethinyl estradiol (SPRINTEC 28) 0.25-35 MG-MCG tablet, Take 1 tablet by mouth daily., Disp: 90 tablet, Rfl: 3   rosuvastatin (CRESTOR) 20 MG tablet, Take 1 tablet (20 mg total) by mouth daily., Disp: 90 tablet, Rfl: 3   SYNTHROID 25 MCG tablet, TAKE 1 TABLET BY MOUTH DAILY, Disp: 90 tablet, Rfl: 3   Review of Systems:   ROS Negative unless otherwise specified per HPI. Vitals:   Vitals:   05/28/21 0947  BP: (!) 137/96  Pulse: 67  Temp: 98.2 F (36.8 C)  TempSrc: Temporal  SpO2: 99%  Weight: 178 lb 2 oz (80.8 kg)  Height: $Remove'5\' 3"'MPjcZFK$  (1.6 m)     Body mass index is 31.55 kg/m.  Physical  Exam:   Physical Exam Vitals and nursing note reviewed.  Constitutional:      General: She is not in acute distress.    Appearance: She is well-developed. She is not ill-appearing or toxic-appearing.  Cardiovascular:     Rate and Rhythm: Normal rate and regular rhythm.     Pulses: Normal pulses.     Heart sounds: Normal heart sounds, S1 normal and S2 normal.  Pulmonary:     Effort: Pulmonary effort is normal.     Breath sounds: Normal breath sounds.  Skin:    General: Skin is warm and dry.  Neurological:     Mental Status: She is alert.     GCS: GCS eye subscore is 4. GCS verbal subscore is 5. GCS motor subscore is 6.  Psychiatric:        Speech: Speech normal.        Behavior:  Behavior normal. Behavior is cooperative.   Results for orders placed or performed in visit on 05/28/21  POCT urine pregnancy  Result Value Ref Range   Preg Test, Ur Negative Negative     Assessment and Plan:   Vaginal Itching Urine pregnancy test results Completed self vaginal swab for further evaluation -- will treat based on results Recommend abstinence until results have returned Encouraged condom use  Needed Immunization for HPV  Completed second dose today Will follow up for final dose in 6 months  OCP Refill She needs refill of sprintec. Tolerating well. Follow-up in 1 year, sooner if concerns.   I,Havlyn C Ratchford,acting as a Education administrator for [employer], PA.,have documented all relevant documentation on the behalf of Inda Coke, PA,as directed by  Inda Coke, PA while in the presence of Inda Coke, [place].   I, Inda Coke, [place], have reviewed all documentation for this visit. The documentation on 05/28/21 for the exam, diagnosis, procedures, and orders are all accurate and complete.   Inda Coke, PA-C

## 2021-05-28 ENCOUNTER — Other Ambulatory Visit (HOSPITAL_COMMUNITY)
Admission: RE | Admit: 2021-05-28 | Discharge: 2021-05-28 | Disposition: A | Discharge status: Home / Self Care | Payer: 59 | Source: Ambulatory Visit | Attending: Physician Assistant | Admitting: Physician Assistant

## 2021-05-28 ENCOUNTER — Other Ambulatory Visit: Payer: Self-pay

## 2021-05-28 ENCOUNTER — Other Ambulatory Visit (HOSPITAL_BASED_OUTPATIENT_CLINIC_OR_DEPARTMENT_OTHER): Payer: Self-pay

## 2021-05-28 ENCOUNTER — Ambulatory Visit: Payer: 59

## 2021-05-28 ENCOUNTER — Encounter: Payer: Self-pay | Admitting: Physician Assistant

## 2021-05-28 ENCOUNTER — Ambulatory Visit: Payer: 59 | Admitting: Physician Assistant

## 2021-05-28 VITALS — BP 137/96 | HR 67 | Temp 98.2°F | Ht 63.0 in | Wt 178.1 lb

## 2021-05-28 DIAGNOSIS — Z23 Encounter for immunization: Secondary | ICD-10-CM | POA: Diagnosis not present

## 2021-05-28 DIAGNOSIS — N898 Other specified noninflammatory disorders of vagina: Secondary | ICD-10-CM | POA: Diagnosis not present

## 2021-05-28 DIAGNOSIS — Z3041 Encounter for surveillance of contraceptive pills: Secondary | ICD-10-CM

## 2021-05-28 LAB — POCT URINE PREGNANCY: Preg Test, Ur: NEGATIVE

## 2021-05-28 MED ORDER — NORGESTIMATE-ETH ESTRADIOL 0.25-35 MG-MCG PO TABS
1.0000 | ORAL_TABLET | Freq: Every day | ORAL | 3 refills | Status: DC
  Filled 2021-05-28: qty 84, 84d supply, fill #0
  Filled 2021-08-13: qty 84, 84d supply, fill #1

## 2021-05-28 NOTE — Patient Instructions (Signed)
It was great to see you!  I will be in touch with your vaginal swab results and any recommendations at that time!  Take care,  Jarold Motto PA-C

## 2021-05-29 LAB — CERVICOVAGINAL ANCILLARY ONLY
Bacterial Vaginitis (gardnerella): POSITIVE — AB
Candida Glabrata: NEGATIVE
Candida Vaginitis: NEGATIVE
Chlamydia: NEGATIVE
Comment: NEGATIVE
Comment: NEGATIVE
Comment: NEGATIVE
Comment: NEGATIVE
Comment: NEGATIVE
Comment: NORMAL
Neisseria Gonorrhea: NEGATIVE
Trichomonas: NEGATIVE

## 2021-05-30 ENCOUNTER — Other Ambulatory Visit: Payer: Self-pay | Admitting: Physician Assistant

## 2021-05-30 ENCOUNTER — Other Ambulatory Visit (HOSPITAL_BASED_OUTPATIENT_CLINIC_OR_DEPARTMENT_OTHER): Payer: Self-pay

## 2021-05-30 MED ORDER — METRONIDAZOLE 500 MG PO TABS
500.0000 mg | ORAL_TABLET | Freq: Two times a day (BID) | ORAL | 0 refills | Status: AC
  Filled 2021-05-30: qty 14, 7d supply, fill #0

## 2021-06-20 ENCOUNTER — Other Ambulatory Visit (HOSPITAL_BASED_OUTPATIENT_CLINIC_OR_DEPARTMENT_OTHER): Payer: Self-pay

## 2021-08-13 ENCOUNTER — Other Ambulatory Visit (HOSPITAL_COMMUNITY): Payer: Self-pay

## 2021-08-14 ENCOUNTER — Other Ambulatory Visit: Payer: Self-pay | Admitting: *Deleted

## 2021-08-14 MED ORDER — NORGESTIMATE-ETH ESTRADIOL 0.25-35 MG-MCG PO TABS: 1.0000 | ORAL_TABLET | Freq: Every day | ORAL | 2 refills | Status: DC

## 2021-11-17 ENCOUNTER — Encounter: Payer: Self-pay | Admitting: Physician Assistant

## 2021-11-18 ENCOUNTER — Other Ambulatory Visit (HOSPITAL_BASED_OUTPATIENT_CLINIC_OR_DEPARTMENT_OTHER): Payer: Self-pay

## 2021-11-18 MED ORDER — NORGESTIMATE-ETH ESTRADIOL 0.25-35 MG-MCG PO TABS
1.0000 | ORAL_TABLET | Freq: Every day | ORAL | 1 refills | Status: DC
  Filled 2021-11-18: qty 84, 84d supply, fill #0
  Filled 2022-02-05: qty 84, 84d supply, fill #1

## 2022-02-05 ENCOUNTER — Other Ambulatory Visit (HOSPITAL_BASED_OUTPATIENT_CLINIC_OR_DEPARTMENT_OTHER): Payer: Self-pay

## 2022-03-16 ENCOUNTER — Encounter: Payer: Self-pay | Admitting: *Deleted

## 2022-03-24 DIAGNOSIS — R030 Elevated blood-pressure reading, without diagnosis of hypertension: Secondary | ICD-10-CM | POA: Diagnosis not present

## 2022-03-24 DIAGNOSIS — R1013 Epigastric pain: Secondary | ICD-10-CM | POA: Diagnosis not present

## 2022-03-25 DIAGNOSIS — K824 Cholesterolosis of gallbladder: Secondary | ICD-10-CM | POA: Diagnosis not present

## 2022-04-29 DIAGNOSIS — Z124 Encounter for screening for malignant neoplasm of cervix: Secondary | ICD-10-CM | POA: Diagnosis not present

## 2022-04-29 DIAGNOSIS — Z01419 Encounter for gynecological examination (general) (routine) without abnormal findings: Secondary | ICD-10-CM | POA: Diagnosis not present

## 2022-04-29 DIAGNOSIS — Z113 Encounter for screening for infections with a predominantly sexual mode of transmission: Secondary | ICD-10-CM | POA: Diagnosis not present

## 2022-04-29 DIAGNOSIS — Z7251 High risk heterosexual behavior: Secondary | ICD-10-CM | POA: Diagnosis not present

## 2022-04-29 DIAGNOSIS — Z1151 Encounter for screening for human papillomavirus (HPV): Secondary | ICD-10-CM | POA: Diagnosis not present

## 2022-04-29 DIAGNOSIS — Z3009 Encounter for other general counseling and advice on contraception: Secondary | ICD-10-CM | POA: Diagnosis not present

## 2022-04-29 LAB — HM PAP SMEAR

## 2022-05-18 ENCOUNTER — Other Ambulatory Visit: Payer: Self-pay | Admitting: Physician Assistant

## 2022-05-19 ENCOUNTER — Other Ambulatory Visit (HOSPITAL_BASED_OUTPATIENT_CLINIC_OR_DEPARTMENT_OTHER): Payer: Self-pay

## 2022-05-19 MED ORDER — ROSUVASTATIN CALCIUM 20 MG PO TABS
20.0000 mg | ORAL_TABLET | Freq: Every day | ORAL | 0 refills | Status: DC
  Filled 2022-05-19: qty 90, 90d supply, fill #0

## 2022-05-19 MED ORDER — METFORMIN HCL 500 MG PO TABS
500.0000 mg | ORAL_TABLET | Freq: Every day | ORAL | 0 refills | Status: DC
  Filled 2022-05-19: qty 90, 90d supply, fill #0

## 2022-06-04 ENCOUNTER — Encounter: Payer: Self-pay | Admitting: *Deleted

## 2022-08-19 DIAGNOSIS — R051 Acute cough: Secondary | ICD-10-CM | POA: Diagnosis not present

## 2023-04-30 DIAGNOSIS — Z3202 Encounter for pregnancy test, result negative: Secondary | ICD-10-CM | POA: Diagnosis not present

## 2023-04-30 DIAGNOSIS — Z3009 Encounter for other general counseling and advice on contraception: Secondary | ICD-10-CM | POA: Diagnosis not present

## 2023-04-30 DIAGNOSIS — Z01419 Encounter for gynecological examination (general) (routine) without abnormal findings: Secondary | ICD-10-CM | POA: Diagnosis not present

## 2023-04-30 DIAGNOSIS — Z30011 Encounter for initial prescription of contraceptive pills: Secondary | ICD-10-CM | POA: Diagnosis not present

## 2023-06-10 DIAGNOSIS — J069 Acute upper respiratory infection, unspecified: Secondary | ICD-10-CM | POA: Diagnosis not present

## 2023-07-02 DIAGNOSIS — B3731 Acute candidiasis of vulva and vagina: Secondary | ICD-10-CM | POA: Diagnosis not present

## 2023-07-02 DIAGNOSIS — N760 Acute vaginitis: Secondary | ICD-10-CM | POA: Diagnosis not present

## 2023-12-16 DIAGNOSIS — H109 Unspecified conjunctivitis: Secondary | ICD-10-CM | POA: Diagnosis not present

## 2023-12-16 DIAGNOSIS — J020 Streptococcal pharyngitis: Secondary | ICD-10-CM | POA: Diagnosis not present

## 2023-12-16 DIAGNOSIS — J3089 Other allergic rhinitis: Secondary | ICD-10-CM | POA: Diagnosis not present

## 2024-01-24 DIAGNOSIS — S83422A Sprain of lateral collateral ligament of left knee, initial encounter: Secondary | ICD-10-CM | POA: Diagnosis not present

## 2024-01-25 DIAGNOSIS — M2392 Unspecified internal derangement of left knee: Secondary | ICD-10-CM | POA: Diagnosis not present

## 2024-01-25 DIAGNOSIS — M25562 Pain in left knee: Secondary | ICD-10-CM | POA: Diagnosis not present

## 2024-03-23 DIAGNOSIS — M542 Cervicalgia: Secondary | ICD-10-CM | POA: Diagnosis not present

## 2024-03-23 DIAGNOSIS — M9901 Segmental and somatic dysfunction of cervical region: Secondary | ICD-10-CM | POA: Diagnosis not present

## 2024-03-23 DIAGNOSIS — M9902 Segmental and somatic dysfunction of thoracic region: Secondary | ICD-10-CM | POA: Diagnosis not present

## 2024-03-23 DIAGNOSIS — M9905 Segmental and somatic dysfunction of pelvic region: Secondary | ICD-10-CM | POA: Diagnosis not present

## 2024-03-23 DIAGNOSIS — M9903 Segmental and somatic dysfunction of lumbar region: Secondary | ICD-10-CM | POA: Diagnosis not present

## 2024-03-23 DIAGNOSIS — M25562 Pain in left knee: Secondary | ICD-10-CM | POA: Diagnosis not present

## 2024-03-23 DIAGNOSIS — M25552 Pain in left hip: Secondary | ICD-10-CM | POA: Diagnosis not present

## 2024-03-23 DIAGNOSIS — M25561 Pain in right knee: Secondary | ICD-10-CM | POA: Diagnosis not present

## 2024-03-27 ENCOUNTER — Encounter: Payer: Self-pay | Admitting: Physician Assistant

## 2024-03-27 ENCOUNTER — Other Ambulatory Visit (HOSPITAL_COMMUNITY)
Admission: RE | Admit: 2024-03-27 | Discharge: 2024-03-27 | Disposition: A | Discharge status: Home / Self Care | Source: Ambulatory Visit | Attending: Physician Assistant | Admitting: Physician Assistant

## 2024-03-27 ENCOUNTER — Ambulatory Visit (INDEPENDENT_AMBULATORY_CARE_PROVIDER_SITE_OTHER): Admitting: Physician Assistant

## 2024-03-27 VITALS — BP 130/80 | HR 57 | Temp 97.9°F | Ht 63.0 in | Wt 190.0 lb

## 2024-03-27 DIAGNOSIS — E349 Endocrine disorder, unspecified: Secondary | ICD-10-CM | POA: Diagnosis not present

## 2024-03-27 DIAGNOSIS — T7840XD Allergy, unspecified, subsequent encounter: Secondary | ICD-10-CM | POA: Diagnosis not present

## 2024-03-27 DIAGNOSIS — E063 Autoimmune thyroiditis: Secondary | ICD-10-CM

## 2024-03-27 DIAGNOSIS — N898 Other specified noninflammatory disorders of vagina: Secondary | ICD-10-CM | POA: Insufficient documentation

## 2024-03-27 DIAGNOSIS — Z0000 Encounter for general adult medical examination without abnormal findings: Secondary | ICD-10-CM | POA: Diagnosis not present

## 2024-03-27 LAB — CBC WITH DIFFERENTIAL/PLATELET
Basophils Absolute: 0 K/uL (ref 0.0–0.1)
Basophils Relative: 0.5 % (ref 0.0–3.0)
Eosinophils Absolute: 0.1 K/uL (ref 0.0–0.7)
Eosinophils Relative: 0.9 % (ref 0.0–5.0)
HCT: 41.4 % (ref 36.0–46.0)
Hemoglobin: 14 g/dL (ref 12.0–15.0)
Lymphocytes Relative: 28.6 % (ref 12.0–46.0)
Lymphs Abs: 2.2 K/uL (ref 0.7–4.0)
MCHC: 33.8 g/dL (ref 30.0–36.0)
MCV: 87.2 fl (ref 78.0–100.0)
Monocytes Absolute: 0.5 K/uL (ref 0.1–1.0)
Monocytes Relative: 7 % (ref 3.0–12.0)
Neutro Abs: 4.7 K/uL (ref 1.4–7.7)
Neutrophils Relative %: 63 % (ref 43.0–77.0)
Platelets: 248 K/uL (ref 150.0–400.0)
RBC: 4.74 Mil/uL (ref 3.87–5.11)
RDW: 13 % (ref 11.5–15.5)
WBC: 7.5 K/uL (ref 4.0–10.5)

## 2024-03-27 LAB — T4, FREE: Free T4: 0.81 ng/dL (ref 0.60–1.60)

## 2024-03-27 LAB — TSH: TSH: 1.95 u[IU]/mL (ref 0.35–5.50)

## 2024-03-27 LAB — T3, FREE: T3, Free: 4 pg/mL (ref 2.3–4.2)

## 2024-03-27 LAB — HEMOGLOBIN A1C: Hgb A1c MFr Bld: 5.5 % (ref 4.6–6.5)

## 2024-03-27 NOTE — Progress Notes (Signed)
 Subjective:    Pamela Bennett is a 23 y.o. female and is here for a comprehensive physical exam.  HPI  Health Maintenance Due  Topic Date Due   Hepatitis C Screening  Never done   HPV VACCINES (3 - 3-dose series) 10/26/2021    Discussed the use of AI scribe software for clinical note transcription with the patient, who gave verbal consent to proceed.  History of Present Illness   Pamela Bennett is a 23 year old female who presents for a comprehensive health evaluation and lab work.  She has a history of thyroid  issues and has not taken her medications for the last three years. She seeks to monitor her thyroid  function through lab work to determine if medication is necessary.  While living in [place] , she experienced increased allergy symptoms and breathing difficulties, requiring an albuterol  inhaler and breathing treatments. Since returning, her symptoms have normalized, but she may need a refill for her inhaler.  She has experienced mild acute itching and discomfort, previously diagnosed as bacterial vaginosis and treated with Flagyl . She performed a self-swab due to recent symptoms and seeks evaluation for potential yeast or bacterial vaginosis.  Her social history includes increased alcohol consumption, averaging five to six drinks twice a week, due to current life stressors. She does not smoke or vape and maintains a daily running routine. Recent dietary changes have led to decreased food intake and a 15-pound weight gain.  She reports regular menstrual cycles since discontinuing birth control, with periods lasting four days and manageable symptoms. Mild cramping and back pain occur on the first day, with spotting by day four.  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 No chest pain, unusual headaches, numbness, tingling, tremors, or digestive issues.      Health Maintenance: Immunizations --  declined  Colonoscopy -- n/a Mammogram -- n/a PAP -- UpToDate per patient Bone Density -- n/a Diet -- overall high protein Exercise -- runs daily; lifts weight daily  Sleep habits -- overall stable Mood -- no major concerns  UTD with dentist? - no UTD with eye doctor? - no  Weight history: Wt Readings from Last 10 Encounters:  03/27/24 190 lb (86.2 kg)  05/28/21 178 lb 2 oz (80.8 kg)  03/25/21 181 lb 8 oz (82.3 kg)  01/15/20 170 lb 6.1 oz (77.3 kg)  11/15/19 172 lb 6.4 oz (78.2 kg)  06/29/19 179 lb 4 oz (81.3 kg)  06/05/19 182 lb 6.4 oz (82.7 kg)  12/26/18 179 lb (81.2 kg)  06/27/18 179 lb 9.6 oz (81.5 kg) (94%, Z= 1.59)*  06/13/18 177 lb 12.8 oz (80.6 kg) (94%, Z= 1.55)*   * Growth percentiles are based on CDC (Girls, 2-20 Years) data.   Body mass index is 33.66 kg/m. Patient's last menstrual period was 03/20/2024 (exact date).  Alcohol use:  reports current alcohol use of about 1.0 standard drink of alcohol per week.  Tobacco use:  Tobacco Use: Low Risk  (03/27/2024)   Patient History    Smoking Tobacco Use: Never    Smokeless Tobacco Use: Never    Passive Exposure: Not on file   Eligible for lung cancer screening? no     03/27/2024    1:12 PM  Depression screen PHQ 2/9  Decreased Interest 0  Down, Depressed, Hopeless 0  PHQ - 2 Score 0     Other providers/specialists: Patient Care Team: Job Lukes, [place] as PCP - General (  Physician Assistant)    PMHx, SurgHx, SocialHx, Medications, and Allergies were reviewed in the Visit Navigator and updated as appropriate.   Past Medical History:  Diagnosis Date   Allergy    Hypothyroidism    Ligament tear 02/21/2015   right small finger RCL tear     Past Surgical History:  Procedure Laterality Date   CLOSED REDUCTION RADIAL / ULNAR SHAFT FRACTURE Left 06/22/2002   FRACTURE SURGERY  2004   LIGAMENT REPAIR Right 03/15/2015   Procedure: RIGHT SMALL FINGER RADIAL COLLATERAL LIGAMENT RECONSTRUCTION ;   Surgeon: Elsie Mussel, MD;  Location: [facility];  Service: Orthopedics;  Laterality: Right;   PROXIMAL INTERPHALANGEAL FUSION (PIP) Right 03/15/2015   Procedure: RIGHT PROXIMAL INTERPHALANGEAL FUSION (PIP) JOINT WITH PALMARIS GRAFT;  Surgeon: Elsie Mussel, MD;  Location: [facility];  Service: Orthopedics;  Laterality: Right;   SUPPRELIN  IMPLANT Left 10/14/2009   upper arm     Family History  Adopted: Yes    Social History   Tobacco Use   Smoking status: Never   Smokeless tobacco: Never  Substance Use Topics   Alcohol use: Yes    Alcohol/week: 1.0 standard drink of alcohol    Types: 1 Cans of beer per week   Drug use: No    Review of Systems:   Review of Systems  Constitutional:  Negative for chills, fever, malaise/fatigue and weight loss.  HENT:  Negative for hearing loss, sinus pain and sore throat.   Respiratory:  Negative for cough and hemoptysis.   Cardiovascular:  Negative for chest pain, palpitations, leg swelling and PND.  Gastrointestinal:  Negative for abdominal pain, constipation, diarrhea, heartburn, nausea and vomiting.  Genitourinary:  Negative for dysuria, frequency and urgency.  Musculoskeletal:  Negative for back pain, myalgias and neck pain.  Skin:  Negative for itching and rash.  Neurological:  Negative for dizziness, tingling, seizures and headaches.  Endo/Heme/Allergies:  Negative for polydipsia.  Psychiatric/Behavioral:  Negative for depression. The patient is not nervous/anxious.     Objective:   BP 130/80 (BP Location: Left Arm, Patient Position: Sitting, Cuff Size: Large)   Pulse (!) 57   Temp 97.9 F (36.6 C) (Temporal)   Ht 5' 3 (1.6 m)   Wt 190 lb (86.2 kg)   LMP 03/20/2024 (Exact Date)   BMI 33.66 kg/m  Body mass index is 33.66 kg/m.   General Appearance:    Alert, cooperative, no distress, appears stated age  Head:    Normocephalic, without obvious abnormality, atraumatic  Eyes:    PERRL,  conjunctiva/corneas clear, EOM's intact, fundi    benign, both eyes  Ears:    Normal TM's and external ear canals, both ears  Nose:   Nares normal, septum midline, mucosa normal, no drainage    or sinus tenderness  Throat:   Lips, mucosa, and tongue normal; teeth and gums normal  Neck:   Supple, symmetrical, trachea midline, no adenopathy;    thyroid :  no enlargement/tenderness/nodules; no carotid   bruit or JVD  Back:     Symmetric, no curvature, ROM normal, no CVA tenderness  Lungs:     Clear to auscultation bilaterally, respirations unlabored  Chest Wall:    No tenderness or deformity   Heart:    Regular rate and rhythm, S1 and S2 normal, no murmur, rub or gallop  Breast Exam:    Deferred   Abdomen:     Soft, non-tender, bowel sounds active all four quadrants,    no masses,  no organomegaly  Genitalia:    Deferred   Extremities:   Extremities normal, atraumatic, no cyanosis or edema  Pulses:   2+ and symmetric all extremities  Skin:   Skin color, texture, turgor normal, no rashes or lesions  Lymph nodes:   Cervical, supraclavicular, and axillary nodes normal  Neurologic:   CNII-XII intact, normal strength, sensation and reflexes    throughout    Assessment/Plan:   Assessment and Plan    Adult Wellness Visit Managing health through urgent care visits without a primary care provider for three years. Interested in reviewing thyroid  function, lipid panel, and A1c levels. History of thyroid  issues and hyperlipidemia, not on medication for three years. Prefers monitoring health without medication if possible. - Order comprehensive lab work including thyroid  panel, lipid panel, and A1c. - Discuss results and potential need for medication based on lab findings.  Vaginal itching  Mild acute itching and discomfort, possibly due to yeast infection or bacterial vaginosis (BV). Similar symptoms in the past treated with Flagyl  for BV. - Perform self-swab test for yeast and BV. - Prescribe  Diflucan for yeast infection if positive. - Prescribe Flagyl  for BV if positive.  Hypothyroidism, acquired, autoimmune Thyroid  issues without current symptoms. Last thyroid  imaging in 2019 was normal. Interested in monitoring thyroid  function through lab work. - Order thyroid  panel as part of comprehensive lab work.  Hyperlipidemia, unspecified Previously on low-dose Crestor  but not consistent with medication. Prefers to manage condition through diet and exercise. - Order lipid panel as part of comprehensive lab work. - Discuss dietary modifications and fiber intake to manage cholesterol levels.  Allerry Allergies and breathing issues, particularly in [place] , requiring albuterol  inhaler and breathing treatments. Symptoms normalized since returning from [place] . - Prescribe albuterol  inhaler. - Refer to allergist for allergy testing.   Lucie Buttner, PA-C [facility]

## 2024-03-28 ENCOUNTER — Ambulatory Visit: Payer: Self-pay | Admitting: Physician Assistant

## 2024-03-28 DIAGNOSIS — M25552 Pain in left hip: Secondary | ICD-10-CM | POA: Diagnosis not present

## 2024-03-28 DIAGNOSIS — M9903 Segmental and somatic dysfunction of lumbar region: Secondary | ICD-10-CM | POA: Diagnosis not present

## 2024-03-28 DIAGNOSIS — M542 Cervicalgia: Secondary | ICD-10-CM | POA: Diagnosis not present

## 2024-03-28 DIAGNOSIS — M9901 Segmental and somatic dysfunction of cervical region: Secondary | ICD-10-CM | POA: Diagnosis not present

## 2024-03-28 DIAGNOSIS — M25561 Pain in right knee: Secondary | ICD-10-CM | POA: Diagnosis not present

## 2024-03-28 DIAGNOSIS — M9902 Segmental and somatic dysfunction of thoracic region: Secondary | ICD-10-CM | POA: Diagnosis not present

## 2024-03-28 DIAGNOSIS — M9905 Segmental and somatic dysfunction of pelvic region: Secondary | ICD-10-CM | POA: Diagnosis not present

## 2024-03-28 DIAGNOSIS — M25562 Pain in left knee: Secondary | ICD-10-CM | POA: Diagnosis not present

## 2024-03-28 LAB — COMPREHENSIVE METABOLIC PANEL WITH GFR
ALT: 36 U/L — ABNORMAL HIGH (ref 0–35)
AST: 33 U/L (ref 0–37)
Albumin: 4.7 g/dL (ref 3.5–5.2)
Alkaline Phosphatase: 60 U/L (ref 39–117)
BUN: 11 mg/dL (ref 6–23)
CO2: 26 meq/L (ref 19–32)
Calcium: 9.6 mg/dL (ref 8.4–10.5)
Chloride: 100 meq/L (ref 96–112)
Creatinine, Ser: 0.74 mg/dL (ref 0.40–1.20)
GFR: 112.31 mL/min (ref >60.00)
Glucose, Bld: 98 mg/dL (ref 70–99)
Potassium: 3.7 meq/L (ref 3.5–5.1)
Sodium: 136 meq/L (ref 135–145)
Total Bilirubin: 0.5 mg/dL (ref 0.2–1.2)
Total Protein: 7.9 g/dL (ref 6.0–8.3)

## 2024-03-28 LAB — CERVICOVAGINAL ANCILLARY ONLY
Bacterial Vaginitis (gardnerella): NEGATIVE
Candida Glabrata: NEGATIVE
Candida Vaginitis: NEGATIVE
Comment: NEGATIVE
Comment: NEGATIVE
Comment: NEGATIVE

## 2024-03-28 LAB — LIPID PANEL
Cholesterol: 253 mg/dL — ABNORMAL HIGH (ref 0–200)
HDL: 44.6 mg/dL (ref >39.00)
NonHDL: 208.52
Total CHOL/HDL Ratio: 6
Triglycerides: 574 mg/dL — ABNORMAL HIGH (ref 0.0–149.0)
VLDL: 114.8 mg/dL — ABNORMAL HIGH (ref 0.0–40.0)

## 2024-03-28 LAB — LDL CHOLESTEROL, DIRECT: Direct LDL: 135 mg/dL

## 2024-03-29 ENCOUNTER — Other Ambulatory Visit: Payer: Self-pay | Admitting: Physician Assistant

## 2024-03-29 DIAGNOSIS — E349 Endocrine disorder, unspecified: Secondary | ICD-10-CM

## 2024-03-30 DIAGNOSIS — M25562 Pain in left knee: Secondary | ICD-10-CM | POA: Diagnosis not present

## 2024-03-30 DIAGNOSIS — M9903 Segmental and somatic dysfunction of lumbar region: Secondary | ICD-10-CM | POA: Diagnosis not present

## 2024-03-30 DIAGNOSIS — M542 Cervicalgia: Secondary | ICD-10-CM | POA: Diagnosis not present

## 2024-03-30 DIAGNOSIS — M9901 Segmental and somatic dysfunction of cervical region: Secondary | ICD-10-CM | POA: Diagnosis not present

## 2024-03-30 DIAGNOSIS — M9905 Segmental and somatic dysfunction of pelvic region: Secondary | ICD-10-CM | POA: Diagnosis not present

## 2024-03-30 DIAGNOSIS — M9902 Segmental and somatic dysfunction of thoracic region: Secondary | ICD-10-CM | POA: Diagnosis not present

## 2024-03-30 DIAGNOSIS — M25552 Pain in left hip: Secondary | ICD-10-CM | POA: Diagnosis not present

## 2024-03-30 DIAGNOSIS — M25561 Pain in right knee: Secondary | ICD-10-CM | POA: Diagnosis not present

## 2024-04-03 ENCOUNTER — Other Ambulatory Visit: Payer: Self-pay | Admitting: Physician Assistant

## 2024-04-03 ENCOUNTER — Encounter: Payer: Self-pay | Admitting: Physician Assistant

## 2024-04-03 ENCOUNTER — Other Ambulatory Visit (HOSPITAL_BASED_OUTPATIENT_CLINIC_OR_DEPARTMENT_OTHER): Payer: Self-pay

## 2024-04-03 DIAGNOSIS — M542 Cervicalgia: Secondary | ICD-10-CM | POA: Diagnosis not present

## 2024-04-03 DIAGNOSIS — M9902 Segmental and somatic dysfunction of thoracic region: Secondary | ICD-10-CM | POA: Diagnosis not present

## 2024-04-03 DIAGNOSIS — M9901 Segmental and somatic dysfunction of cervical region: Secondary | ICD-10-CM | POA: Diagnosis not present

## 2024-04-03 DIAGNOSIS — M9905 Segmental and somatic dysfunction of pelvic region: Secondary | ICD-10-CM | POA: Diagnosis not present

## 2024-04-03 DIAGNOSIS — M25561 Pain in right knee: Secondary | ICD-10-CM | POA: Diagnosis not present

## 2024-04-03 DIAGNOSIS — M25562 Pain in left knee: Secondary | ICD-10-CM | POA: Diagnosis not present

## 2024-04-03 DIAGNOSIS — M25552 Pain in left hip: Secondary | ICD-10-CM | POA: Diagnosis not present

## 2024-04-03 DIAGNOSIS — M9903 Segmental and somatic dysfunction of lumbar region: Secondary | ICD-10-CM | POA: Diagnosis not present

## 2024-04-03 MED ORDER — ROSUVASTATIN CALCIUM 20 MG PO TABS
20.0000 mg | ORAL_TABLET | Freq: Every day | ORAL | 1 refills | Status: DC
  Filled 2024-04-03: qty 30, 30d supply, fill #0
  Filled 2024-05-15: qty 30, 30d supply, fill #1

## 2024-04-03 MED ORDER — ROSUVASTATIN CALCIUM 10 MG PO TABS
10.0000 mg | ORAL_TABLET | Freq: Every day | ORAL | 1 refills | Status: DC
  Filled 2024-04-03: qty 30, 30d supply, fill #0

## 2024-04-05 DIAGNOSIS — M25561 Pain in right knee: Secondary | ICD-10-CM | POA: Diagnosis not present

## 2024-04-05 DIAGNOSIS — M25562 Pain in left knee: Secondary | ICD-10-CM | POA: Diagnosis not present

## 2024-04-05 DIAGNOSIS — M9902 Segmental and somatic dysfunction of thoracic region: Secondary | ICD-10-CM | POA: Diagnosis not present

## 2024-04-05 DIAGNOSIS — M25552 Pain in left hip: Secondary | ICD-10-CM | POA: Diagnosis not present

## 2024-04-05 DIAGNOSIS — M9905 Segmental and somatic dysfunction of pelvic region: Secondary | ICD-10-CM | POA: Diagnosis not present

## 2024-04-05 DIAGNOSIS — M9903 Segmental and somatic dysfunction of lumbar region: Secondary | ICD-10-CM | POA: Diagnosis not present

## 2024-04-05 DIAGNOSIS — M542 Cervicalgia: Secondary | ICD-10-CM | POA: Diagnosis not present

## 2024-04-05 DIAGNOSIS — M9901 Segmental and somatic dysfunction of cervical region: Secondary | ICD-10-CM | POA: Diagnosis not present

## 2024-04-12 ENCOUNTER — Encounter: Payer: Self-pay | Admitting: Internal Medicine

## 2024-04-12 ENCOUNTER — Ambulatory Visit: Admitting: Internal Medicine

## 2024-04-12 VITALS — BP 118/62 | HR 62 | Temp 98.6°F | Resp 20 | Ht 64.0 in | Wt 195.9 lb

## 2024-04-12 DIAGNOSIS — T7800XA Anaphylactic reaction due to unspecified food, initial encounter: Secondary | ICD-10-CM

## 2024-04-12 DIAGNOSIS — T7819XD Other adverse food reactions, not elsewhere classified, subsequent encounter: Secondary | ICD-10-CM | POA: Diagnosis not present

## 2024-04-12 DIAGNOSIS — H6993 Unspecified Eustachian tube disorder, bilateral: Secondary | ICD-10-CM | POA: Diagnosis not present

## 2024-04-12 DIAGNOSIS — J4520 Mild intermittent asthma, uncomplicated: Secondary | ICD-10-CM | POA: Diagnosis not present

## 2024-04-12 DIAGNOSIS — J3089 Other allergic rhinitis: Secondary | ICD-10-CM | POA: Diagnosis not present

## 2024-04-12 DIAGNOSIS — T7819XA Other adverse food reactions, not elsewhere classified, initial encounter: Secondary | ICD-10-CM

## 2024-04-12 NOTE — Patient Instructions (Signed)
 Allergic asthma and allergic rhinitis due to pollen and dust Asthma and rhinitis exacerbated by pollen and dust, controlled in current location.  Worse in [place] , especially with exercise.  1 systemic steroid in past 12 months.  - Rescue Inhaler: Airsupra 2 puffs. Use  every 4-6 hours as needed for chest tightness, wheezing, or coughing.  Can also use 15 minutes prior to exercise if you have symptoms with activity. - Asthma is not controlled if:  - Symptoms are occurring >2 times a week OR  - >2 times a month nighttime awakenings  - You are requiring systemic steroids (prednisone /steroid injections) more than once per year  - Your require hospitalization for your asthma.  - Please call the clinic to schedule a follow up if these symptoms arise  Allergic Rhinitis  - Return for allergy testing (1-55)   - avoid all antihistamines for 3 days prior - Start daily antihistamine as needed: Options include Zyrtec, Allegra, Zyzal, Claritin - Start Flonase 1 spray per nostril twice daily.  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 Cooked carrots tolerated. - Avoid raw carrots if symptomatic, cooked carrots are safe. - Consider allergy immunotherapy aka allergy shots for treatment  - No epipen  needed   Suspected allergy to orange food dye Reaction to orange food dye, testing unavailable. Avoidance advised due to testing limitations. - Avoid orange food dye. 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 High false positive rate in testing. - Will add almond to allergy test  - Depending on results may consider reintroduction as this was likely a sensitization not true food allergy   Eustachian tube dysfunction Eustachian tube  dysfunction related to nasal congestion and jaw clenching. - Flonase 1 spray per nostril twice daily. Aim upward and outward  - Pop ears 10 times daily to keep Eustachian tubes open.  Allergy immunotherapy consideration Immunotherapy considered for symptomatic individuals, potential to cure allergies. Standard and rush options available, with 80% effectiveness. - Consider standard or rush immunotherapy based on logistics and insurance. - Coordinate with [place]  allergist for dual-location administration if needed   Follow up: Tuesday at 9:30 for allergy testing (1-55, almond, orange, carrott)   -hold all antihistamines for 3 days prior

## 2024-04-12 NOTE — Progress Notes (Signed)
 NEW PATIENT Date of Service/Encounter:  04/12/24 Referring provider: Job Lukes, PA Primary care provider: Job Lukes, [place]  Subjective:  Pamela Bennett is a 23 y.o. female  presenting today for evaluation of food allergy, environmental allergies  History obtained from: chart review and patient.   Discussed the use of AI scribe software for clinical note transcription with the patient, who gave verbal consent to proceed.  History of Present Illness Pamela Bennett is a 23 year old female who presents for re-evaluation of allergies and to establish care.  Food-related hypersensitivity reactions - History of reaction to orange food dye in elementary school after ingestion of a cupcake and possibly candy, resulting in symptoms managed with Benadryl and no hospitalization - Mouth itching and tongue swelling after consuming raw carrots; tolerates cooked carrots without symptoms - No mouth itching or other symptoms with other fruits or vegetables - Avoids almonds entirely due to being informed of an allergy, but has never knowingly ingested them - has an epipen  and interested in the updated allergy testing  Environmental allergic rhinitis and asthma symptoms - Environmental allergies to trees, grass, and dust - Significant symptoms in [place] , particularly due to oak and cedar exposure - Breathing difficulties requiring three breathing treatments and use of albuterol  inhaler, especially during outdoor activities or busy workdays with dust exposure - Urgent care visits and steroid injection for breathing issues within the past year - Symptoms well-controlled in the past 30 days since returning to current location, with only mild symptoms managed by Zyrtec - In current location, history of milder symptoms including sneezing and mild congestion, manageable with over-the-counter medications such as Sudafed, Zyrtec, and Claritin  Recent environmental  changes - Temporarily relocated from [place]  to current location to care for her mother with cancer - Lived most of her life in current location, where allergy symptoms have historically been milder and manageable      Other allergy screening: Asthma: RAD  Rhino conjunctivitis: yes Food allergy: yes Medication allergy: no Hymenoptera allergy: no Urticaria: no Eczema:no History of recurrent infections suggestive of immunodeficency: no Vaccinations are up to date.   Past Medical History: Past Medical History:  Diagnosis Date   Allergy    Hypothyroidism    Ligament tear 02/21/2015   right small finger RCL tear   Medication List:  Current Outpatient Medications  Medication Sig Dispense Refill   EPINEPHrine  (EPIPEN  2-PAK) 0.3 mg/0.3 mL IJ SOAJ injection Inject 0.3 mLs (0.3 mg total) into the muscle as needed for anaphylaxis. 2 each 0   rosuvastatin  (CRESTOR ) 20 MG tablet Take 1 tablet (20 mg total) by mouth daily. 30 tablet 1   No current facility-administered medications for this visit.   Known Allergies:  Allergies  Allergen Reactions   Almond Oil Other (See Comments)    GI UPSET   Carrot [Daucus Carota] Other (See Comments)    GI UPSET   Past Surgical History: Past Surgical History:  Procedure Laterality Date   CLOSED REDUCTION RADIAL / ULNAR SHAFT FRACTURE Left 06/22/2002   FRACTURE SURGERY  2004   LIGAMENT REPAIR Right 03/15/2015   Procedure: RIGHT SMALL FINGER RADIAL COLLATERAL LIGAMENT RECONSTRUCTION ;  Surgeon: Elsie Mussel, MD;  Location: [facility];  Service: Orthopedics;  Laterality: Right;   PROXIMAL INTERPHALANGEAL FUSION (PIP) Right 03/15/2015   Procedure: RIGHT PROXIMAL INTERPHALANGEAL FUSION (PIP) JOINT WITH PALMARIS GRAFT;  Surgeon: Elsie Mussel, MD;  Location: [facility];  Service: Orthopedics;  Laterality: Right;  SUPPRELIN  IMPLANT Left 10/14/2009   upper arm   Family History: Family History  Adopted: Yes   Problem Relation Age of Onset   Asthma Neg Hx    Allergy (severe) Neg Hx    Lupus Neg Hx    COPD Neg Hx    Angioedema Neg Hx    Emphysema Neg Hx    Social History: Pamela Bennett lives [facility], that is 23 years old, no water damage, wood and carpet in bedroom, electric heating, central HVAC, dog with access to bedroom, no roaches int he house and bed is 2 feet off the floor, no tobacco exposure, works as a Chiropractor, splits time between [place]  and here due to mom being recently diagnoses with cancer.   ROS:  All other systems negative except as noted per HPI.  Objective:  Blood pressure 118/62, pulse 62, temperature 98.6 F (37 C), temperature source Oral, resp. rate 20, height 5' 4 (1.626 m), weight 195 lb 14.4 oz (88.9 kg), last menstrual period 03/20/2024, SpO2 97%. Body mass index is 33.63 kg/m. Physical Exam:  General Appearance:  Alert, cooperative, no distress, appears stated age  Head:  Normocephalic, without obvious abnormality, atraumatic  Eyes:  Conjunctiva clear, EOM's intact  Ears EACs normal bilaterally and normal TMs bilaterally  Nose: Nares normal, hypertrophic turbinates, normal mucosa, no visible anterior polyps, and septum midline  Throat: Lips, tongue normal; teeth and gums normal, normal posterior oropharynx  Neck: Supple, symmetrical  Lungs:   clear to auscultation bilaterally, Respirations unlabored, no coughing  Heart:  regular rate and rhythm and no murmur, Appears well perfused  Extremities: No edema  Skin: Skin color, texture, turgor normal and no rashes or lesions on visualized portions of skin  Neurologic: No gross deficits   Diagnostics: Spirometry:  Tracings reviewed. Her effort: Good reproducible efforts. FVC: 3.62L (pre),  FEV1: 3.28L, 108% predicted (pre),  FEV1/FVC ratio: 91 (pre),  Interpretation: Spirometry consistent with normal pattern.  Please see scanned spirometry results for details.   Labs:  Lab Orders  No laboratory test(s)  ordered today     Assessment and Plan  Assessment and Plan Assessment & Plan Allergic asthma and allergic rhinitis due to pollen and dust Asthma and rhinitis exacerbated by pollen and dust, controlled in current location.  Worse in [place] , especially with exercise.  1 systemic steroid in past 12 months.  - Rescue Inhaler: Airsupra 2 puffs. Use  every 4-6 hours as needed for chest tightness, wheezing, or coughing.  Can also use 15 minutes prior to exercise if you have symptoms with activity. - Asthma is not controlled if:  - Symptoms are occurring >2 times a week OR  - >2 times a month nighttime awakenings  - You are requiring systemic steroids (prednisone /steroid injections) more than once per year  - Your require hospitalization for your asthma.  - Please call the clinic to schedule a follow up if these symptoms arise  Allergic Rhinitis  - Return for allergy testing (1-55)   - avoid all antihistamines for 3 days prior - Start daily antihistamine as needed: Options include Zyrtec, Allegra, Zyzal, Claritin - Start Flonase 1 spray per nostril twice daily.  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 Cooked carrots tolerated. - Avoid raw carrots if symptomatic, cooked carrots are safe. - Consider allergy immunotherapy aka allergy shots for treatment  - No epipen  needed  Suspected allergy to orange food dye Reaction to orange food dye, testing unavailable. Avoidance advised due to testing limitations. - Avoid orange food dye. 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 High false positive rate in testing. - Will add almond to allergy test  - Depending on results may consider reintroduction as this was likely a  sensitization not true food allergy   Eustachian tube dysfunction Eustachian tube dysfunction related to nasal congestion and jaw clenching. - Flonase 1 spray per nostril twice daily. Aim upward and outward  - Pop ears 10 times daily to keep Eustachian tubes open.  Allergy immunotherapy consideration Immunotherapy considered for symptomatic individuals, potential to cure allergies. Standard and rush options available, with 80% effectiveness. - Consider standard or rush immunotherapy based on logistics and insurance. - Coordinate with [place]  allergist for dual-location administration if needed   Follow up: Tuesday at 9:30 for allergy testing (1-55, almond, orange, carrott)   -hold all antihistamines for 3 days prior       This note in its entirety was forwarded to the Provider who requested this consultation.  Other: reviewed spirometry technique and reviewed inhaler technique  Thank you for your kind referral. I appreciate the opportunity to take part in Austyn's care. Please do not hesitate to contact me with questions.  Sincerely,  Thank you so much for letting me partake in your care today.  Don't hesitate to reach out if you have any additional concerns!  Hargis Springer, MD  [facility] [place]

## 2024-04-13 DIAGNOSIS — M9905 Segmental and somatic dysfunction of pelvic region: Secondary | ICD-10-CM | POA: Diagnosis not present

## 2024-04-13 DIAGNOSIS — M9901 Segmental and somatic dysfunction of cervical region: Secondary | ICD-10-CM | POA: Diagnosis not present

## 2024-04-13 DIAGNOSIS — M25562 Pain in left knee: Secondary | ICD-10-CM | POA: Diagnosis not present

## 2024-04-13 DIAGNOSIS — M25552 Pain in left hip: Secondary | ICD-10-CM | POA: Diagnosis not present

## 2024-04-13 DIAGNOSIS — M25561 Pain in right knee: Secondary | ICD-10-CM | POA: Diagnosis not present

## 2024-04-13 DIAGNOSIS — M9903 Segmental and somatic dysfunction of lumbar region: Secondary | ICD-10-CM | POA: Diagnosis not present

## 2024-04-13 DIAGNOSIS — M9902 Segmental and somatic dysfunction of thoracic region: Secondary | ICD-10-CM | POA: Diagnosis not present

## 2024-04-13 DIAGNOSIS — M542 Cervicalgia: Secondary | ICD-10-CM | POA: Diagnosis not present

## 2024-04-14 NOTE — Addendum Note (Signed)
 Addended by: MARINDA JANSKY on: 04/14/2024 08:34 AM   Modules accepted: Orders

## 2024-04-17 DIAGNOSIS — M9905 Segmental and somatic dysfunction of pelvic region: Secondary | ICD-10-CM | POA: Diagnosis not present

## 2024-04-17 DIAGNOSIS — M9902 Segmental and somatic dysfunction of thoracic region: Secondary | ICD-10-CM | POA: Diagnosis not present

## 2024-04-17 DIAGNOSIS — M25552 Pain in left hip: Secondary | ICD-10-CM | POA: Diagnosis not present

## 2024-04-17 DIAGNOSIS — M542 Cervicalgia: Secondary | ICD-10-CM | POA: Diagnosis not present

## 2024-04-17 DIAGNOSIS — M9901 Segmental and somatic dysfunction of cervical region: Secondary | ICD-10-CM | POA: Diagnosis not present

## 2024-04-17 DIAGNOSIS — M9903 Segmental and somatic dysfunction of lumbar region: Secondary | ICD-10-CM | POA: Diagnosis not present

## 2024-04-17 DIAGNOSIS — M25561 Pain in right knee: Secondary | ICD-10-CM | POA: Diagnosis not present

## 2024-04-17 DIAGNOSIS — M25562 Pain in left knee: Secondary | ICD-10-CM | POA: Diagnosis not present

## 2024-04-18 ENCOUNTER — Ambulatory Visit (INDEPENDENT_AMBULATORY_CARE_PROVIDER_SITE_OTHER): Admitting: Internal Medicine

## 2024-04-18 DIAGNOSIS — J302 Other seasonal allergic rhinitis: Secondary | ICD-10-CM

## 2024-04-18 DIAGNOSIS — J3089 Other allergic rhinitis: Secondary | ICD-10-CM

## 2024-04-18 MED ORDER — EPINEPHRINE 0.3 MG/0.3ML IJ SOAJ: 0.3000 mg | INTRAMUSCULAR | 1 refills | Status: DC | PRN

## 2024-04-18 NOTE — Patient Instructions (Addendum)
 Allergic asthma and allergic rhinitis due to pollen and dust Asthma and rhinitis exacerbated by pollen and dust, controlled in current location.  Worse in [place] , especially with exercise.  1 systemic steroid in past 12 months.  - Rescue Inhaler: Airsupra 2 puffs. Use  every 4-6 hours as needed for chest tightness, wheezing, or coughing.  Can also use 15 minutes prior to exercise if you have symptoms with activity. - Asthma is not controlled if:  - Symptoms are occurring >2 times a week OR  - >2 times a month nighttime awakenings  - You are requiring systemic steroids (prednisone /steroid injections) more than once per year  - Your require hospitalization for your asthma.  - Please call the clinic to schedule a follow up if these symptoms arise  Allergic Rhinitis  - Allergy test (04/18/24): grass, weed, tree, mold, dust mite, cat, mixed feathers, horse  - Avoidance measures  - Continue daily antihistamine as needed: Options include Zyrtec, Allegra, Zyzal, Claritin - Continue Flonase 1 spray per nostril twice daily.  Aim upward and outward Start allergy injections via RUSH  Had a detailed discussion with patient/family that clinical history is suggestive of allergic rhinitis, and may benefit from allergy immunotherapy (AIT). Discussed in detail regarding the dosing, schedule, side effects (mild to moderate local allergic reaction and rarely systemic allergic reactions including anaphylaxis/death), alternatives and benefits (significant improvement in nasal symptoms, seasonal flares of asthma) of immunotherapy with the patient. There is significant time commitment involved with allergy shots, which includes weekly immunotherapy injections for first 9-12 months and then biweekly to monthly injections for 3-5 years. Clinical response is often delayed and patient may not see an improvement for 6-12 months. Consent was signed. I have prescribed epinephrine  injectable and demonstrated proper use. For mild  symptoms you can take over the counter antihistamines such as Benadryl and monitor symptoms closely. If symptoms worsen or if you have severe symptoms including breathing issues, throat closure, significant swelling, whole body hives, severe diarrhea and vomiting, lightheadedness then inject epinephrine  and seek immediate medical care afterwards. Action plan given.    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 Cooked carrots tolerated. - Avoid raw carrots if symptomatic, cooked carrots are safe. - Consider allergy immunotherapy aka allergy shots for treatment  - No epipen  needed   Suspected allergy to orange food dye Reaction to orange food dye, testing unavailable. Avoidance advised due to testing limitations. - Avoid orange food dye. 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 High false positive rate in testing. - Allergy test: mildly positive to almond,  - Will wait until she is established on AIT then consider food challenge   Eustachian tube dysfunction Eustachian tube dysfunction related to nasal congestion and jaw clenching. - Flonase 1 spray per nostril twice daily. Aim upward and outward  - Pop ears 10 times daily to keep Eustachian tubes open.  Follow up: for RUSH   Reducing Pollen Exposure  The American Academy of Allergy, Asthma and Immunology suggests the following steps to reduce your exposure to pollen during allergy seasons.    Do not hang sheets or clothing out to dry; pollen may collect on these items. Do not mow lawns or spend time around freshly cut grass; mowing stirs up pollen. Keep windows closed at night.  Keep car windows closed while  driving. Minimize morning activities outdoors, a time when pollen counts are usually at their  highest. Stay indoors as much as possible when pollen counts or humidity is high and on windy days when pollen tends to remain in the air longer. Use air conditioning when possible.  Many air conditioners have filters that trap the pollen spores. Use a HEPA room air filter to remove pollen form the indoor air you breathe.  Control of Mold Allergen   Mold and fungi can grow on a variety of surfaces provided certain temperature and moisture conditions exist.  Outdoor molds grow on plants, decaying vegetation and soil.  The major outdoor mold, Alternaria and Cladosporium, are found in very high numbers during hot and dry conditions.  Generally, a late Summer - Fall peak is seen for common outdoor fungal spores.  Rain will temporarily lower outdoor mold spore count, but counts rise rapidly when the rainy period ends.  The most important indoor molds are Aspergillus and Penicillium.  Dark, humid and poorly ventilated basements are ideal sites for mold growth.  The next most common sites of mold growth are the bathroom and the kitchen.  Outdoor (Seasonal) Mold Control  Use air conditioning and keep windows closed Avoid exposure to decaying vegetation. Avoid leaf raking. Avoid grain handling. Consider wearing a face mask if working in moldy areas.    Indoor (Perennial) Mold Control   Maintain humidity below 50%. Clean washable surfaces with 5% bleach solution. Remove sources e.g. contaminated carpets.    DUST MITE AVOIDANCE MEASURES:  There are three main measures that need and can be taken to avoid house dust mites:  Reduce accumulation of dust in general -reduce furniture, clothing, carpeting, books, stuffed animals, especially in bedroom  Separate yourself from the dust -use pillow and mattress encasements (can be found at stores such as [employer] or online) -avoid direct exposure to air condition flow -use a HEPA filter device, especially in the bedroom; you can also use  a HEPA filter vacuum cleaner -wipe dust with a moist towel instead of a dry towel or broom when cleaning  Decrease mites and/or their secretions -wash clothing and linen and stuffed animals at highest temperature possible, at least every 2 weeks -stuffed animals can also be placed in a bag and put in a freezer overnight  Despite the above measures, it is impossible to eliminate dust mites or their allergen completely from your home.  With the above measures the burden of mites in your home can be diminished, with the goal of minimizing your allergic symptoms.  Success will be reached only when implementing and using all means together.  Control of Dog or Cat Allergen  Avoidance is the best way to manage a dog or cat allergy. If you have a dog or cat and are allergic to dog or cats, consider removing the dog or cat from the home. If you have a dog or cat but don't want to find it a new home, or if your family wants a pet even though someone in the household is allergic, here are some strategies that may help keep symptoms at bay:  Keep the pet out of your bedroom and restrict it to only a few rooms. Be advised that keeping the dog or cat in only one room will not limit the allergens to that room. Don't pet, hug or kiss the dog or cat; if you do, wash your hands with soap and water. High-efficiency particulate air (HEPA) cleaners run  continuously in a bedroom or living room can reduce allergen levels over time. Regular use of a high-efficiency vacuum cleaner or a central vacuum can reduce allergen levels. Giving your dog or cat a bath at least once a week can reduce airborne allergen.

## 2024-04-18 NOTE — Progress Notes (Unsigned)
  Date of Service/Encounter:  04/18/24  Allergy testing appointment   Initial visit on 04/12/24, seen for rhinitis .  Please see that note for additional details.  Today reports for allergy diagnostic testing:    DIAGNOSTICS:  Skin Testing: Environmental allergy panel. Adequate positive and negative controls Results discussed with patient/family.  Airborne Adult Perc - 04/18/24 0943     Time Antigen Placed 9056    Allergen Manufacturer Jestine    Location Back    Number of Test 55    1. Control-Buffer 50% Glycerol Negative    2. Control-Histamine 4+    3. Bahia 4+    4. Bermuda 4+    5. Johnson 4+    6. [place]  Blue 4+    7. Meadow Fescue 4+    8. Perennial Rye Negative    9. Timothy 4+    10. Ragweed Mix 2+    11. Cocklebur 2+    12. Plantain,  English 2+    13. Baccharis 2+    14. Dog Fennel 2+    15. Russian Thistle 2+    16. Lamb's Quarters Negative    17. Sheep Sorrell Negative    18. Rough Pigweed 2+    19. Marsh Elder, Rough Negative    20. Mugwort, Common Negative    21. Box, Elder Negative    22. Cedar, red 3+    23. Sweet Gum 4+    24. Pecan Pollen 4+    25. Pine Mix Negative    26. Walnut, Black Pollen 4+    27. Red Mulberry 4+    28. Ash Mix 4+    29. Birch Mix 4+    30. Beech American 4+    31. Cottonwood, Eastern 2+    32. Hickory, White 4+    33. Maple Mix Negative    34. Oak, Eastern Mix 4+    35. Sycamore Eastern 2+    36. Alternaria Alternata 3+    37. Cladosporium Herbarum 3+    38. Aspergillus Mix Negative    39. Penicillium Mix Negative    40. Bipolaris Sorokiniana (Helminthosporium) 2+    41. Drechslera Spicifera (Curvularia) Negative    42. Mucor Plumbeus Negative    43. Fusarium Moniliforme 2+    44. Aureobasidium Pullulans (pullulara) 2+    45. Rhizopus Oryzae Negative    46. Botrytis Cinera Negative    47. Epicoccum Nigrum Negative    48. Phoma Betae Negative    49. Dust Mite Mix 3+    50. Cat Hair 10,000 BAU/ml 5+    51.   Dog Epithelia 3+    52. Mixed Feathers 2+    53. Horse Epithelia 2+    54. Cockroach, German Negative    55. Tobacco Leaf Negative          Food Adult Perc - 04/18/24 0900     Time Antigen Placed 9056    Allergen Manufacturer Jestine    Location Back    Number of allergen test 3    12. Almond 2+    50. Carrots 3+    55. Orange  Negative          Allergy testing results were read and interpreted by myself, documented by clinical staff.  Patient provided with copy of allergy testing along with avoidance measures when indicated.   Hargis Springer, MD  [facility]

## 2024-04-26 DIAGNOSIS — M9901 Segmental and somatic dysfunction of cervical region: Secondary | ICD-10-CM | POA: Diagnosis not present

## 2024-04-26 DIAGNOSIS — M25552 Pain in left hip: Secondary | ICD-10-CM | POA: Diagnosis not present

## 2024-04-26 DIAGNOSIS — M25562 Pain in left knee: Secondary | ICD-10-CM | POA: Diagnosis not present

## 2024-04-26 DIAGNOSIS — M9905 Segmental and somatic dysfunction of pelvic region: Secondary | ICD-10-CM | POA: Diagnosis not present

## 2024-04-26 DIAGNOSIS — M9902 Segmental and somatic dysfunction of thoracic region: Secondary | ICD-10-CM | POA: Diagnosis not present

## 2024-04-26 DIAGNOSIS — M542 Cervicalgia: Secondary | ICD-10-CM | POA: Diagnosis not present

## 2024-04-26 DIAGNOSIS — M9903 Segmental and somatic dysfunction of lumbar region: Secondary | ICD-10-CM | POA: Diagnosis not present

## 2024-04-26 DIAGNOSIS — M25561 Pain in right knee: Secondary | ICD-10-CM | POA: Diagnosis not present

## 2024-05-04 ENCOUNTER — Other Ambulatory Visit (HOSPITAL_BASED_OUTPATIENT_CLINIC_OR_DEPARTMENT_OTHER): Payer: Self-pay

## 2024-05-16 ENCOUNTER — Other Ambulatory Visit (INDEPENDENT_AMBULATORY_CARE_PROVIDER_SITE_OTHER)

## 2024-05-16 DIAGNOSIS — E349 Endocrine disorder, unspecified: Secondary | ICD-10-CM

## 2024-05-16 LAB — LIPID PANEL
Cholesterol: 120 mg/dL (ref 0–200)
HDL: 39.1 mg/dL (ref >39.00)
LDL Cholesterol: 51 mg/dL (ref 0–99)
NonHDL: 81.37
Total CHOL/HDL Ratio: 3
Triglycerides: 152 mg/dL — ABNORMAL HIGH (ref 0.0–149.0)
VLDL: 30.4 mg/dL (ref 0.0–40.0)

## 2024-05-16 LAB — COMPREHENSIVE METABOLIC PANEL WITH GFR
ALT: 39 U/L — ABNORMAL HIGH (ref 0–35)
AST: 31 U/L (ref 0–37)
Albumin: 4.5 g/dL (ref 3.5–5.2)
Alkaline Phosphatase: 58 U/L (ref 39–117)
BUN: 12 mg/dL (ref 6–23)
CO2: 27 meq/L (ref 19–32)
Calcium: 9.3 mg/dL (ref 8.4–10.5)
Chloride: 105 meq/L (ref 96–112)
Creatinine, Ser: 0.89 mg/dL (ref 0.40–1.20)
GFR: 89.91 mL/min (ref >60.00)
Glucose, Bld: 97 mg/dL (ref 70–99)
Potassium: 4.7 meq/L (ref 3.5–5.1)
Sodium: 137 meq/L (ref 135–145)
Total Bilirubin: 0.6 mg/dL (ref 0.2–1.2)
Total Protein: 7.3 g/dL (ref 6.0–8.3)

## 2024-05-17 ENCOUNTER — Ambulatory Visit: Payer: Self-pay | Admitting: Physician Assistant

## 2024-05-23 ENCOUNTER — Encounter: Payer: Self-pay | Admitting: Physician Assistant

## 2024-05-23 ENCOUNTER — Other Ambulatory Visit: Payer: Self-pay | Admitting: Physician Assistant

## 2024-05-23 DIAGNOSIS — R7989 Other specified abnormal findings of blood chemistry: Secondary | ICD-10-CM

## 2024-05-30 ENCOUNTER — Other Ambulatory Visit: Payer: Self-pay | Admitting: Internal Medicine

## 2024-05-30 DIAGNOSIS — J302 Other seasonal allergic rhinitis: Secondary | ICD-10-CM

## 2024-06-01 ENCOUNTER — Other Ambulatory Visit (HOSPITAL_BASED_OUTPATIENT_CLINIC_OR_DEPARTMENT_OTHER): Payer: Self-pay

## 2024-06-01 ENCOUNTER — Encounter: Payer: Self-pay | Admitting: Physician Assistant

## 2024-06-01 ENCOUNTER — Ambulatory Visit: Admitting: Physician Assistant

## 2024-06-01 ENCOUNTER — Other Ambulatory Visit (HOSPITAL_COMMUNITY)
Admission: RE | Admit: 2024-06-01 | Discharge: 2024-06-01 | Disposition: A | Source: Ambulatory Visit | Attending: Physician Assistant | Admitting: Physician Assistant

## 2024-06-01 VITALS — BP 120/84 | HR 56 | Temp 97.4°F | Ht 64.0 in | Wt 189.0 lb

## 2024-06-01 DIAGNOSIS — E785 Hyperlipidemia, unspecified: Secondary | ICD-10-CM

## 2024-06-01 DIAGNOSIS — R7989 Other specified abnormal findings of blood chemistry: Secondary | ICD-10-CM

## 2024-06-01 DIAGNOSIS — Z309 Encounter for contraceptive management, unspecified: Secondary | ICD-10-CM | POA: Diagnosis not present

## 2024-06-01 DIAGNOSIS — Z113 Encounter for screening for infections with a predominantly sexual mode of transmission: Secondary | ICD-10-CM

## 2024-06-01 MED ORDER — NORGESTIMATE-ETH ESTRADIOL 0.25-35 MG-MCG PO TABS
1.0000 | ORAL_TABLET | Freq: Every day | ORAL | 11 refills | Status: AC
  Filled 2024-06-01 (×2): qty 84, 84d supply, fill #0

## 2024-06-01 NOTE — Progress Notes (Signed)
 History of Present Illness:   Chief Complaint  Patient presents with   Contraception    Pt would like to discuss contraception options.    Discussed the use of AI scribe software for clinical note transcription with the patient, who gave verbal consent to proceed.  History of Present Illness   Pamela Bennett is a 23 year old female who presents for a discussion on contraceptive options and management of hyperlipidemia.  She stopped Sprintec  by choice and wants to discuss restarting it versus other options, including an IUD. She is concerned about side effects and possible impact on future fertility. Her main goal is to prevent pregnancy.  She takes Crestor  for hyperlipidemia without muscle pain or other adverse effects. She recalls a prior ultrasound showing mild fatty liver and connects this to past poor diet and alcohol use in college.  She wants STD screening and plans to provide a vaginal swab for gonorrhea, chlamydia, and trichomonas. This was prompted by her boyfriend, who has regular military health checks. She has no abnormal discharge or other STD symptoms.        Past Medical History:  Diagnosis Date   Allergy     Hypothyroidism    Ligament tear 02/21/2015   right small finger RCL tear     Social History[1]  Past Surgical History:  Procedure Laterality Date   CLOSED REDUCTION RADIAL / ULNAR SHAFT FRACTURE Left 06/22/2002   FRACTURE SURGERY  2004   LIGAMENT REPAIR Right 03/15/2015   Procedure: RIGHT SMALL FINGER RADIAL COLLATERAL LIGAMENT RECONSTRUCTION ;  Surgeon: Elsie Mussel, MD;  Location: [facility];  Service: Orthopedics;  Laterality: Right;   PROXIMAL INTERPHALANGEAL FUSION (PIP) Right 03/15/2015   Procedure: RIGHT PROXIMAL INTERPHALANGEAL FUSION (PIP) JOINT WITH PALMARIS GRAFT;  Surgeon: Elsie Mussel, MD;  Location: [facility];  Service: Orthopedics;  Laterality: Right;   SUPPRELIN  IMPLANT Left  10/14/2009   upper arm    Family History  Adopted: Yes  Problem Relation Age of Onset   Asthma Neg Hx    Allergy  (severe) Neg Hx    Lupus Neg Hx    COPD Neg Hx    Angioedema Neg Hx    Emphysema Neg Hx     Allergies[2]  Current Medications:  Current Medications[3]   Review of Systems:   Negative unless otherwise specified per HPI.  Vitals:   Vitals:   06/01/24 1027  BP: 120/84  Pulse: (!) 56  Temp: (!) 97.4 F (36.3 C)  TempSrc: Temporal  SpO2: 98%  Weight: 189 lb (85.7 kg)  Height: 5' 4 (1.626 m)     Body mass index is 32.44 kg/m.  Physical Exam:   Physical Exam Vitals and nursing note reviewed.  Constitutional:      General: She is not in acute distress.    Appearance: She is well-developed. She is not ill-appearing or toxic-appearing.  Cardiovascular:     Rate and Rhythm: Normal rate and regular rhythm.     Pulses: Normal pulses.     Heart sounds: Normal heart sounds, S1 normal and S2 normal.  Pulmonary:     Effort: Pulmonary effort is normal.     Breath sounds: Normal breath sounds.  Skin:    General: Skin is warm and dry.  Neurological:     Mental Status: She is alert.     GCS: GCS eye subscore is 4. GCS verbal subscore is 5. GCS motor subscore is 6.  Psychiatric:  Speech: Speech normal.        Behavior: Behavior normal. Behavior is cooperative.     Assessment and Plan:   Assessment and Plan    Contraceptive management Considering restarting Sprintec  for contraception. Discussed IUD as an alternative, addressing concerns about side effects and fertility. Explained IUD's localized hormone effects and lower error rate. Clarified oral contraceptives require daily intake and may cause initial breakthrough bleeding. - Restart Sprintec  for contraception. - Use backup contraception (condoms) for the first week after starting Sprintec . - Referred to gynecologist for IUD consultation if interested.  Screening for sexually transmitted  infections Screening requested by partner. No symptoms. Discussed vaginal swab for gonorrhea, chlamydia, and trichomonas. - Ordered vaginal swab for gonorrhea, chlamydia, and trichomonas.  Elevated liver function tests  Mild fatty liver noted in 2020. Current liver labs slightly elevated. Discussed monitoring liver health and potential treatments. Emphasized exercise and diet as primary treatments. - Ordered liver ultrasound to assess current status. - Continue exercise and healthy diet.  Hyperlipidemia On Crestor  with improved triglycerides. Discussed Vascepa if triglycerides remain elevated or if statin is discontinued. Current levels near normal, Vascepa not indicated. - Continue Crestor . - Monitor triglyceride levels.        Lucie Buttner, PA-C    [1]  Social History Tobacco Use   Smoking status: Never   Smokeless tobacco: Never  Substance Use Topics   Alcohol use: Yes    Alcohol/week: 5.0 standard drinks of alcohol    Types: 5 Cans of beer per week   Drug use: No  [2]  Allergies Allergen Reactions   Almond Oil Other (See Comments)    GI UPSET   Carrot [Daucus Carota] Other (See Comments)    GI UPSET  [3]  Current Outpatient Medications:    EPINEPHrine  (EPIPEN  2-PAK) 0.3 mg/0.3 mL IJ SOAJ injection, Inject 0.3 mg into the muscle as needed., Disp: 1 each, Rfl: 1   norgestimate -ethinyl estradiol  (SPRINTEC  28) 0.25-35 MG-MCG tablet, Take 1 tablet by mouth daily., Disp: 28 tablet, Rfl: 11   rosuvastatin  (CRESTOR ) 20 MG tablet, Take 1 tablet (20 mg total) by mouth daily., Disp: 30 tablet, Rfl: 1

## 2024-06-02 ENCOUNTER — Ambulatory Visit: Payer: Self-pay | Admitting: Physician Assistant

## 2024-06-02 ENCOUNTER — Inpatient Hospital Stay: Admission: RE | Admit: 2024-06-02 | Discharge: 2024-06-02 | Attending: Physician Assistant

## 2024-06-02 DIAGNOSIS — R7401 Elevation of levels of liver transaminase levels: Secondary | ICD-10-CM | POA: Diagnosis not present

## 2024-06-02 DIAGNOSIS — R7989 Other specified abnormal findings of blood chemistry: Secondary | ICD-10-CM

## 2024-06-02 LAB — CERVICOVAGINAL ANCILLARY ONLY
Chlamydia: NEGATIVE
Comment: NEGATIVE
Comment: NEGATIVE
Comment: NORMAL
Neisseria Gonorrhea: NEGATIVE
Trichomonas: NEGATIVE

## 2024-06-05 NOTE — Progress Notes (Signed)
 WILL MAKE CLOSER TO APPT TIME

## 2024-06-05 NOTE — Progress Notes (Signed)
 Aeroallergen Immunotherapy  Ordering Provider: Hargis Springer, MD  Patient Details Name: Michaelann Gunnoe MRN: [id number] Date of Birth: Nov 03, 2000  Order 1 of 2  Vial Label: G-W-T-D  0.3 ml (Volume)  BAU Concentration -- 7 Grass Mix* 100,000 ([place]  Blue, Victor, Cheraw, Perennial Rye, RedTop, Sweet Vernal, Timothy) 0.2 ml (Volume)  1:20 Concentration -- Bahia 0.3 ml (Volume)  BAU Concentration -- Bermuda 10,000 0.2 ml (Volume)  1:20 Concentration -- Johnson 0.3 ml (Volume)  1:20 Concentration -- Ragweed Mix 0.2 ml (Volume)  1:20 Concentration -- Cocklebur 0.2 ml (Volume)  1:10 Concentration -- Plantain English 0.2 ml (Volume)  1:40 Concentration -- Baccharis 0.2 ml (Volume)  1:80 Concentration -- Dogfennel 0.2 ml (Volume)  1:20 Concentration -- Russian Thistle 0.2 ml (Volume)  1:20 Concentration -- Rough Pigweed* 0.5 ml (Volume)  1:20 Concentration -- Eastern 10 Tree Mix* 0.2 ml (Volume)  1:10 Concentration -- Cedar, red 0.2 ml (Volume)  1:10 Concentration -- Pecan Pollen 0.2 ml (Volume)  1:20 Concentration -- Walnut, Black Pollen 0.2 ml (Volume)  1:20 Concentration -- Red Mulberry 0.5 ml (Volume)  1:10 Concentration -- Dog Epithelia   4.3  ml Extract Subtotal 0.7  ml Diluent 5.0  ml Maintenance Total  Schedule:   Silver Vial (1:10,000): RUSH Green Vial (1:1,000): RUSH Blue Vial (1:100): RUSH Yellow Vial (1:10): Schedule B (6 doses) Red Vial (1:1): Schedule A (14 doses)  Special Instructions: After completion of the first Red Vial, please space to every two weeks. After completion of the second Red Vial, please space to every 4 weeks. Ok to up dose new vials on Schedule D. Ok to come twice weekly, if desired, as long as there is 48 hours between injections.

## 2024-06-05 NOTE — Progress Notes (Signed)
 Aeroallergen Immunotherapy   Ordering Provider: Hargis Springer, MD   Patient Details  Name: Pamela Bennett  MRN: [id number]  Date of Birth: 15-Jun-2001   Order 2 of 2   Vial Label: M-C_DM   0.2 ml (Volume)  1:20 Concentration -- Alternaria alternata  0.2 ml (Volume)  1:20 Concentration -- Cladosporium herbarum  0.2 ml (Volume)  1:20 Concentration -- Bipolaris sorokiniana  0.2 ml (Volume)  1:10 Concentration -- Fusarium moniliforme  0.2 ml (Volume)  1:40 Concentration -- Aureobasidium pullulans  0.5 ml (Volume)   AU Concentration -- Mite Mix (DF 5,000 & DP 5,000)  0.5 ml (Volume)  1:10 Concentration -- Cat Hair     2.0  ml Extract Subtotal  3.0  ml Diluent  5.0  ml Maintenance Total   Schedule:   Silver Vial (1:10,000): RUSH  Green Vial (1:1,000): RUSH  Blue Vial (1:100): RUSH  Yellow Vial (1:10): Schedule B (6 doses)  Red Vial (1:1): Schedule A (14 doses)   Special Instructions: After completion of the first Red Vial, please space to every two weeks. After completion of the second Red Vial, please space to every 4 weeks. Ok to up dose new vials on Schedule D. Ok to come twice weekly, if desired, as long as there is 48 hours between injections.

## 2024-06-07 ENCOUNTER — Ambulatory Visit: Payer: Self-pay | Admitting: Physician Assistant

## 2024-06-08 ENCOUNTER — Encounter: Payer: Self-pay | Admitting: Physician Assistant

## 2024-06-08 ENCOUNTER — Other Ambulatory Visit (HOSPITAL_BASED_OUTPATIENT_CLINIC_OR_DEPARTMENT_OTHER): Payer: Self-pay

## 2024-06-08 ENCOUNTER — Ambulatory Visit: Admitting: Physician Assistant

## 2024-06-08 VITALS — BP 122/84 | HR 54 | Temp 98.2°F | Resp 20 | Ht 63.0 in | Wt 192.2 lb

## 2024-06-08 DIAGNOSIS — Z309 Encounter for contraceptive management, unspecified: Secondary | ICD-10-CM

## 2024-06-08 DIAGNOSIS — J301 Allergic rhinitis due to pollen: Secondary | ICD-10-CM | POA: Diagnosis not present

## 2024-06-08 DIAGNOSIS — Z113 Encounter for screening for infections with a predominantly sexual mode of transmission: Secondary | ICD-10-CM | POA: Diagnosis not present

## 2024-06-08 DIAGNOSIS — K7581 Nonalcoholic steatohepatitis (NASH): Secondary | ICD-10-CM

## 2024-06-08 DIAGNOSIS — W57XXXA Bitten or stung by nonvenomous insect and other nonvenomous arthropods, initial encounter: Secondary | ICD-10-CM | POA: Insufficient documentation

## 2024-06-08 DIAGNOSIS — J302 Other seasonal allergic rhinitis: Secondary | ICD-10-CM | POA: Diagnosis not present

## 2024-06-08 DIAGNOSIS — K824 Cholesterolosis of gallbladder: Secondary | ICD-10-CM

## 2024-06-08 DIAGNOSIS — J3081 Allergic rhinitis due to animal (cat) (dog) hair and dander: Secondary | ICD-10-CM | POA: Diagnosis not present

## 2024-06-08 DIAGNOSIS — J3089 Other allergic rhinitis: Secondary | ICD-10-CM | POA: Diagnosis not present

## 2024-06-08 MED ORDER — EPINEPHRINE 0.3 MG/0.3ML IJ SOAJ
0.3000 mg | INTRAMUSCULAR | 1 refills | Status: AC | PRN
  Filled 2024-06-08: qty 2, 30d supply, fill #0

## 2024-06-08 NOTE — Progress Notes (Signed)
 History of Present Illness:   Chief Complaint  Patient presents with   Exposure to STD    Wants HPV testing as requested by female partner.    Contraception    Extreme bloating from current birth control and would like to discuss changing.     Discussed the use of AI scribe software for clinical note transcription with the patient, who gave verbal consent to proceed.  History of Present Illness   Pamela Bennett is a 23 year old female who presents for additional sexual transmitted infection testing.  She is asymptomatic with no genital warts, abnormal bleeding, dyspareunia, or pelvic pain, and seeks testing for HPV. She received the Gardasil vaccine.  After starting a new birth control pill she developed bloating and constipation within three days, which resolved after stopping the pill and using fiber. She plans to restart the pill after her next menstrual cycle to see if symptoms recur.  She has fatty liver and a gallbladder polyp on recent liver ultrasound. Her boyfriends health anxieties related to his military background contribute to her concern about HPV and need for testing.       Past Medical History:  Diagnosis Date   Allergy     Hypothyroidism    Ligament tear 02/21/2015   right small finger RCL tear     Social History[1]  Past Surgical History:  Procedure Laterality Date   CLOSED REDUCTION RADIAL / ULNAR SHAFT FRACTURE Left 06/22/2002   FRACTURE SURGERY  2004   LIGAMENT REPAIR Right 03/15/2015   Procedure: RIGHT SMALL FINGER RADIAL COLLATERAL LIGAMENT RECONSTRUCTION ;  Surgeon: Elsie Mussel, MD;  Location: [facility];  Service: Orthopedics;  Laterality: Right;   PROXIMAL INTERPHALANGEAL FUSION (PIP) Right 03/15/2015   Procedure: RIGHT PROXIMAL INTERPHALANGEAL FUSION (PIP) JOINT WITH PALMARIS GRAFT;  Surgeon: Elsie Mussel, MD;  Location:  [facility];  Service: Orthopedics;  Laterality: Right;   SUPPRELIN   IMPLANT Left 10/14/2009   upper arm    Family History  Adopted: Yes  Problem Relation Age of Onset   Asthma Neg Hx    Allergy  (severe) Neg Hx    Lupus Neg Hx    COPD Neg Hx    Angioedema Neg Hx    Emphysema Neg Hx     Allergies[2]  Current Medications:  Current Medications[3]   Review of Systems:   Negative unless otherwise specified per HPI.  Vitals:   Vitals:   06/08/24 0952  BP: 122/84  Pulse: (!) 54  Resp: 20  Temp: 98.2 F (36.8 C)  TempSrc: Temporal  SpO2: 98%  Weight: 192 lb 3.2 oz (87.2 kg)  Height: 5' 3 (1.6 m)     Body mass index is 34.05 kg/m.  Physical Exam:   Physical Exam Constitutional:      Appearance: Normal appearance. She is well-developed.  HENT:     Head: Normocephalic and atraumatic.  Eyes:     General: Lids are normal.     Extraocular Movements: Extraocular movements intact.     Conjunctiva/sclera: Conjunctivae normal.  Pulmonary:     Effort: Pulmonary effort is normal.  Musculoskeletal:        General: Normal range of motion.     Cervical back: Normal range of motion and neck supple.  Skin:    General: Skin is warm and dry.  Neurological:     Mental Status: She is alert and oriented to person, place, and time.  Psychiatric:        Attention  and Perception: Attention and perception normal.        Mood and Affect: Mood normal.        Behavior: Behavior normal.        Thought Content: Thought content normal.        Judgment: Judgment normal.     Assessment and Plan:   Assessment and Plan    Sexually transmitted infection screening and counseling Discussed HPV testing, not recommended for women under 30 due to high prevalence and spontaneous clearance. Explained risks of unnecessary procedures if detected. HPV vaccine protects against cervical cancer strains. - Provided printed information on HPV testing and counseling.  *Before leaving office she returned to room and asked to speak with me. She wanted to clarify that  she actually wanted HIV testing and not HPV testing. We will order: HIV, RPR, and HSV.   Contraceptive management Experienced bloating and constipation with Sprintec , resolved upon discontinuation. Previous use did not cause symptoms. Discussed alternative options, prefers to resume Sprintec  and monitor. - Resume Sprintec  after menstrual cycle and monitor symptoms. - Consider alternative contraceptive if symptoms recur.  Nonalcoholic fatty liver disease Fatty liver confirmed on ultrasound. Discussed lifestyle modifications and potential treatment options like Ozempic/Wegovy. Prefers lifestyle changes first. - Continue lifestyle modifications including diet and exercise. - Will consider semaglutide if lifestyle changes are insufficient.  Gallbladder polyp Gallbladder polyp identified on ultrasound, likely benign. Discussed potential symptoms and recommended follow-up ultrasound. - Will repeat gallbladder ultrasound in one year.       Lucie Buttner, PA-C    [1]  Social History Tobacco Use   Smoking status: Never   Smokeless tobacco: Never  Substance Use Topics   Alcohol use: Yes    Alcohol/week: 5.0 standard drinks of alcohol    Types: 5 Cans of beer per week   Drug use: No  [2]  Allergies Allergen Reactions   Almond Oil Other (See Comments)    GI UPSET   Carrot [Daucus Carota] Other (See Comments)    GI UPSET  [3]  Current Outpatient Medications:    rosuvastatin  (CRESTOR ) 20 MG tablet, Take 1 tablet (20 mg total) by mouth daily., Disp: 30 tablet, Rfl: 1   EPINEPHrine  (EPIPEN  2-PAK) 0.3 mg/0.3 mL IJ SOAJ injection, Inject 0.3 mg into the muscle as needed., Disp: 2 each, Rfl: 1   norgestimate -ethinyl estradiol  (SPRINTEC  28) 0.25-35 MG-MCG tablet, Take 1 tablet by mouth daily. (Patient not taking: Reported on 06/08/2024), Disp: 28 tablet, Rfl: 11

## 2024-06-08 NOTE — Telephone Encounter (Signed)
 Please see pt msg and advise if ok to placed future lab orders

## 2024-06-08 NOTE — Progress Notes (Signed)
 VIALS MADE ON 06/09/24

## 2024-06-08 NOTE — Patient Instructions (Addendum)
 Human papillomavirus (HPV) in females is tested using molecular assays that detect DNA or RNA from high-risk HPV types in cervical specimens, and yes, HPV is considered a sexually transmitted infection.[1-3] HPV testing involves collecting cervical cells, typically during a pelvic examination, using either a separate swab or material from a liquid-based cytology specimen.[3-4] The FDA has approved several tests that detect 14 high-risk HPV types (including HPV 16, 18, 31, 33, 35, 39, 45, 51, 52, 56, 58, 59, 66, and 68) associated with cervical cancer.[3-4] These molecular tests detect either viral DNA or messenger RNA from cervical specimens.[3] Current screening options include three approaches: high-risk HPV testing alone every 5 years starting at age 71, HPV testing plus cytology (co-testing) every 5 years starting at age 37, or cytology alone every 3 years for ages 65-29.[4] The American Cancer Society now recommends primary HPV testing beginning at age 24.[5] HPV is transmitted through direct sexual contact, including vaginal, anal, and oral sex, and does not require penetrative intercourse.[2] It is recognized as the most common sexually transmitted infection in the [place]  and worldwide.[1][5-6] High-risk HPV is the causative agent in more than 90% of cervical cancers, with persistent infection by types 16 and 18 responsible for most cases.[1] HPV tests should not be used as general STI screening tests, for female partners of women with HPV, for women under age 55, or for diagnosis of genital warts.[3] There are no FDA-approved HPV tests for use in men or for testing oral or anal specimens.[3][5]  HPV testing is not routinely recommended in women under 34-41 years old because HPV infection is extremely common in this age group but most infections clear spontaneously without progressing to cancer, making routine testing inefficient and likely to lead to unnecessary interventions. The Infectious  Diseases Society of America and American Society for Microbiology note that current screening guidelines from ACOG, ASCCP, and SGO recommend initiating hrHPV testing alone at age 85, or hrHPV testing plus cytology (co-testing) at age 31, while cytology alone is recommended for ages 21-29.[1] The American Cancer Society recommends primary HPV screening starting at age 24.[1] The rationale centers on the natural history of HPV infection in younger women. HPV is highly prevalent in sexually active women under 30, but the vast majority of these infections are transient and resolve without intervention. Testing this population would detect many infections that would never progress to precancerous lesions or cancer, resulting in a high rate of positive tests that do not indicate clinically significant disease. Screening before age 106 is not recommended regardless of sexual history or HPV vaccination status.[1] This applies to all asymptomatic individuals with a cervix. The guidelines emphasize that these recommendations are for average-risk individuals and do not apply to those at increased risk due to immunosuppression, HIV infection, solid organ or stem cell transplantation, or in utero diethylstilbestrol exposure.[1] The American Cancer Society's future guidelines will likely eliminate cytology as a screening option entirely, as data and modeling demonstrate that cytology is less sensitive and specific for determining cancer risk compared to HPV testing.[1]

## 2024-06-09 ENCOUNTER — Ambulatory Visit: Payer: Self-pay | Admitting: Physician Assistant

## 2024-06-09 LAB — HSV 1 AND 2 AB, IGG
HSV 1 Glycoprotein G Ab, IgG: NONREACTIVE
HSV 2 IgG, Type Spec: NONREACTIVE

## 2024-06-10 LAB — HIV ANTIBODY (ROUTINE TESTING W REFLEX)
HIV 1&2 Ab, 4th Generation: NONREACTIVE
HIV FINAL INTERPRETATION: NEGATIVE

## 2024-06-10 LAB — SYPHILIS: RPR W/REFLEX TO RPR TITER AND TREPONEMAL ANTIBODIES, TRADITIONAL SCREENING AND DIAGNOSIS ALGORITHM: RPR Ser Ql: NONREACTIVE

## 2024-06-28 ENCOUNTER — Other Ambulatory Visit (HOSPITAL_BASED_OUTPATIENT_CLINIC_OR_DEPARTMENT_OTHER): Payer: Self-pay

## 2024-06-28 ENCOUNTER — Telehealth: Payer: Self-pay

## 2024-06-28 MED ORDER — FAMOTIDINE 20 MG PO TABS
ORAL_TABLET | ORAL | 0 refills | Status: AC
  Filled 2024-06-28: qty 4, 2d supply, fill #0

## 2024-06-28 MED ORDER — MONTELUKAST SODIUM 10 MG PO TABS
ORAL_TABLET | ORAL | 0 refills | Status: DC
  Filled 2024-06-28: qty 2, 2d supply, fill #0

## 2024-06-28 MED ORDER — LEVOCETIRIZINE DIHYDROCHLORIDE 5 MG PO TABS
ORAL_TABLET | ORAL | 0 refills | Status: DC
  Filled 2024-06-28: qty 2, 2d supply, fill #0

## 2024-06-28 MED ORDER — PREDNISONE 20 MG PO TABS
ORAL_TABLET | ORAL | 0 refills | Status: AC
  Filled 2024-06-28: qty 4, 2d supply, fill #0

## 2024-06-28 NOTE — Telephone Encounter (Signed)
 Pre-meds sent to the pharmacy, pt was notify.

## 2024-07-03 ENCOUNTER — Other Ambulatory Visit: Payer: Self-pay | Admitting: Physician Assistant

## 2024-07-04 ENCOUNTER — Ambulatory Visit: Admitting: Internal Medicine

## 2024-07-04 ENCOUNTER — Other Ambulatory Visit (HOSPITAL_COMMUNITY): Payer: Self-pay

## 2024-07-04 ENCOUNTER — Other Ambulatory Visit: Payer: Self-pay

## 2024-07-04 DIAGNOSIS — J302 Other seasonal allergic rhinitis: Secondary | ICD-10-CM

## 2024-07-04 DIAGNOSIS — J3089 Other allergic rhinitis: Secondary | ICD-10-CM | POA: Diagnosis not present

## 2024-07-04 MED ORDER — ROSUVASTATIN CALCIUM 20 MG PO TABS
20.0000 mg | ORAL_TABLET | Freq: Every day | ORAL | 5 refills | Status: AC
  Filled 2024-07-04: qty 90, 90d supply, fill #0

## 2024-07-04 NOTE — Progress Notes (Unsigned)
" ° °  RAPID DESENSITIZATION Note  RE: Pamela Bennett MRN: [id number] DOB: [birth year] Date of Office Visit: 07/04/2024  Subjective:  Patient presents today for rapid desensitization.  Interval History: Patient has not been ill, she has taken all premedications as per protocol.  Recent/Current History: Pulmonary disease: no Cardiac disease: no Respiratory infection: no Rash: no Itch: no Swelling: no Cough: no Shortness of breath: no Runny/stuffy nose: no Itchy eyes: no Beta-blocker use: no  Patient/guardian was informed of the procedure with verbalized understanding of the risk of anaphylaxis. Consent has been signed.   Medication List:  Current Outpatient Medications  Medication Sig Dispense Refill   EPINEPHrine  (EPIPEN  2-PAK) 0.3 mg/0.3 mL IJ SOAJ injection Inject 0.3 mg into the muscle as needed. 2 each 1   famotidine  (PEPCID ) 20 MG tablet Take 1 tablet twice daily the day before and day of RUSH appt. 4 tablet 0   levocetirizine (XYZAL ) 5 MG tablet Take 1 antihistamine day before and day of RUSH appt. 2 tablet 0   montelukast  (SINGULAIR ) 10 MG tablet Take 1 tab day before and day of RUSH appt. 2 tablet 0   norgestimate -ethinyl estradiol  (SPRINTEC  28) 0.25-35 MG-MCG tablet Take 1 tablet by mouth daily. (Patient not taking: Reported on 06/08/2024) 28 tablet 11   predniSONE  (DELTASONE ) 20 MG tablet Take 2 tabs day before and day of RUSH appt. 4 tablet 0   rosuvastatin  (CRESTOR ) 20 MG tablet Take 1 tablet (20 mg total) by mouth daily. 30 tablet 1   No current facility-administered medications for this visit.   Allergies: Allergies[1] I reviewed her past medical history, social history, family history, and environmental history and no significant changes have been reported from her previous visit.  ROS: Negative except as per HPI.  Objective: There were no vitals taken for this visit. There is no height or weight on file to calculate BMI.   General Appearance:   Alert, cooperative, no distress, appears stated age  Head:  Normocephalic, without obvious abnormality, atraumatic  Eyes:  Conjunctiva clear, EOM's intact  Nose: Nares normal  Throat: Lips, tongue normal; teeth and gums normal, normal  posterior oropharnyx  Neck: Supple, symmetrical  Lungs:   CTAB, Respirations unlabored, no coughing  Heart:  Appears well perfused  Extremities: No edema  Skin: Skin color, texture, turgor normal, no rashes or lesions on visualized portions of skin  Neurologic: No gross deficits     Diagnostics: None done   PROCEDURES:  Step 1:  0.40ml - 1:10,000 dilution (silver vial) Step 2:  0.3ml - 1:10,000 dilution (silver vial) Step 3: 0.1ml - 1:1,000 dilution (green vial)  Step 4: 0.31ml - 1:1,000 dilution (green vial)  Step 5: 0.29ml - 1:100 dilution (blue vial) Step 6: 0.2ml - 1:100 dilution (blue vial) Step 7: 0.3ml - 1:100 dilution (blue vial) Step 8: 0.59ml - 1:100 dilution (blue vial)  Patient was observed for 1 hour after the last dose.   Procedure started at 8:30  Procedure ended at 4:30   ASSESSMENT/PLAN:   Patient has tolerated the rapid desensitization protocol.  Next appointment: Start at 0.05ml of 1:10 dilution (yellow vial) and build up per protocol.  Discussed the use of AI scribe software for clinical note transcription with the patient, who gave verbal consent to proceed.       [1]  Allergies Allergen Reactions   Almond Oil Other (See Comments)    GI UPSET   Carrot [Daucus Carota] Other (See Comments)    GI UPSET   "

## 2024-07-05 ENCOUNTER — Other Ambulatory Visit: Payer: Self-pay

## 2024-07-05 ENCOUNTER — Other Ambulatory Visit (HOSPITAL_BASED_OUTPATIENT_CLINIC_OR_DEPARTMENT_OTHER): Payer: Self-pay

## 2024-07-05 MED ORDER — MONTELUKAST SODIUM 10 MG PO TABS
10.0000 mg | ORAL_TABLET | Freq: Every day | ORAL | 5 refills | Status: AC
  Filled 2024-07-05: qty 30, 30d supply, fill #0

## 2024-07-05 MED ORDER — LEVOCETIRIZINE DIHYDROCHLORIDE 5 MG PO TABS
5.0000 mg | ORAL_TABLET | Freq: Every day | ORAL | 0 refills | Status: AC
  Filled 2024-07-05: qty 2, 2d supply, fill #0

## 2024-07-11 ENCOUNTER — Ambulatory Visit

## 2024-07-11 DIAGNOSIS — J302 Other seasonal allergic rhinitis: Secondary | ICD-10-CM

## 2024-07-19 ENCOUNTER — Ambulatory Visit

## 2024-07-19 DIAGNOSIS — J302 Other seasonal allergic rhinitis: Secondary | ICD-10-CM

## 2024-07-25 ENCOUNTER — Ambulatory Visit

## 2024-07-25 DIAGNOSIS — J302 Other seasonal allergic rhinitis: Secondary | ICD-10-CM | POA: Diagnosis not present

## 2025-03-28 ENCOUNTER — Encounter: Admitting: Physician Assistant
